# Patient Record
Sex: Female | Born: 1965 | Race: White | Hispanic: No | Marital: Married | Smoking: Never smoker
Health system: Southern US, Community
[De-identification: ages and names within clinical notes are randomized; demographics above are authoritative.]

## PROBLEM LIST (undated history)

## (undated) DIAGNOSIS — E039 Hypothyroidism, unspecified: Secondary | ICD-10-CM

## (undated) HISTORY — PX: VAGINAL HYSTERECTOMY: SUR661

## (undated) HISTORY — DX: Hypothyroidism, unspecified: E03.9

---

## 2019-07-23 ENCOUNTER — Telehealth (INDEPENDENT_AMBULATORY_CARE_PROVIDER_SITE_OTHER): Payer: Managed Care, Other (non HMO) | Admitting: Neurology

## 2019-07-23 DIAGNOSIS — G43009 Migraine without aura, not intractable, without status migrainosus: Secondary | ICD-10-CM | POA: Diagnosis not present

## 2019-07-24 ENCOUNTER — Telehealth: Payer: Self-pay | Admitting: Neurology

## 2019-07-24 NOTE — Telephone Encounter (Signed)
Patient called in regarding her medications and her Pharmacy[facility] [place] [place]. She had a VV with Dr. Tomi Likens on 07/23/19. She is needing her medications Zofran, Propanolol and a Migraine medication called in for her. Please double check medication names.  Thanks

## 2019-07-24 NOTE — Telephone Encounter (Signed)
After receiving a call from a Hinton Dyer with a different account # and dob Dr. Tomi Likens called the patient at [phone] to confirm that the patient was seen by him via virtual visit on 07/23/19. Patient confirmed that she was seen by [facility] via virtual visit on 07/23/19. I called and spoke to patient to try and establish the correct account information and discovered that the patient had never been seen by a [facility] provider in the past and therefore did not have an account. I had Lomas , [employer] gather the correct information from the patient and create an account in Epic for the patient. Chelsea created the account and patient was made aware that there was an error in documenting her visit from 07/23/19 due to there being another patient in the system with that same name. Patient verbalized understanding and is aware that we will correct this and have the OV notes from 07/23/19 moved to the correct account. I also informed patient that I would submit this as safety zone portal to make our compliance department aware of the documentation error.

## 2019-07-25 ENCOUNTER — Other Ambulatory Visit: Payer: Self-pay | Admitting: Neurology

## 2019-07-25 MED ORDER — PROPRANOLOL HCL ER 60 MG PO CP24: 60.0000 mg | ORAL_CAPSULE | Freq: Every day | ORAL | 3 refills | Status: DC

## 2019-07-25 MED ORDER — ONDANSETRON 4 MG PO TBDP: 4.0000 mg | ORAL_TABLET | Freq: Three times a day (TID) | ORAL | 3 refills | Status: DC | PRN

## 2019-07-25 MED ORDER — ELETRIPTAN HYDROBROMIDE 40 MG PO TABS: ORAL_TABLET | ORAL | 3 refills | Status: DC

## 2019-07-25 NOTE — Progress Notes (Addendum)
Virtual Visit via Video Note The purpose of this virtual visit is to provide medical care while limiting exposure to the novel coronavirus.    Consent was obtained for video visit:  Yes.   Answered questions that patient had about telehealth interaction:  Yes.   I discussed the limitations, risks, security and privacy concerns of performing an evaluation and management service by telemedicine. I also discussed with the patient that there may be a patient responsible charge related to this service. The patient expressed understanding and agreed to proceed.  Pt location: Home Physician Location: office Name of referring provider:  No ref. provider found I connected with Shelby Maxwell at patients initiation/request on 07/23/2019 at  2:50 PM EDT by video enabled telemedicine application and verified that I am speaking with the correct person using two identifiers. Pt MRN:  [id number] Pt DOB:  Mar 30, 1966 Video Participants:  Shelby Maxwell   History of Present Illness:  Shelby Maxwell is a 53 year old female with Hashimoto's thyroiditis who presents for migraines.  History supplemented by referring provider note.   Onset:  In her 44s.  Over the past year, they have been more frequent.   Location:  Mostly frontal but sometimes base of neck Quality:  Pressure, throbbing, pounding, squeezing Intensity:  8/10.  She denies new headache, thunderclap headache Aura:  no Premonitory Phase:  no Postdrome:  no Associated symptoms:  Nausea, photophobia, phonophobia, osmophobia.  She denies associated visual disturbance, vomiting, unilateral numbness or weakness. Duration:  She often wakes up with them.  8 to 10 hours.  Severe once last 24 hours Frequency:  2 to 3 days a week, once a month she cannot function Frequency of abortive medication: 2 to 3 days a week. Triggers:  Certain smells (blueberry scent, perfume, scented candles), dairy, very cold beverages, sweets Relieving factors:  Rest in  bed Activity:  Once a month she cannot function   Current NSAIDS:  ASA 81mg  Current analgesics:  Goody Current triptans:  none Current ergotamine:  none Current anti-emetic:  none Current muscle relaxants:  none Current anti-anxiolytic:  none Current sleep aide:  none Current Antihypertensive medications:  none Current Antidepressant medications:  Wellbutrin Current Anticonvulsant medications:  none Current anti-CGRP:  none Current Vitamins/Herbal/Supplements:  D Current Antihistamines/Decongestants:  none Other therapy:  none Hormone/birth control:  none   Past NSAIDS:  ibuprofen Past analgesics:  Tylenol Past abortive triptans:  Sumatriptan 50mg  tablet (causes head pressure and feels like throat is closing) Past abortive ergotamine:  none Past muscle relaxants:  none Past anti-emetic:  Zofran Past antihypertensive medications:  none Past antidepressant medications:  Lexapro (weight gain) Past anticonvulsant medications:  none Past anti-CGRP:  none Past vitamins/Herbal/Supplements:  none Past antihistamines/decongestants:  none Other past therapies:  none   Caffeine:  1 cup of coffee daily Diet:  Drinks 2-3 16 oz bottles of water daily.  Tries to eat healthy.  Eats 3 meals a day.  No soda Exercise:  no Depression:  controlled; Anxiety:  Controlled.  She has a child with ADHD and autism Other pain:  no Sleep hygiene:  Ok (6-8 hours a night) Family history of headache:  mother  Past Medical History: Hashimoto's thyroiditis  Medications: Wellbutrin ASA  Allergies: No known allergies  Family History: Mother:  Migraines, CAD  Social History: Social History   Socioeconomic History  . Marital status: Married    Spouse name: Not on file  . Number of children: Not on file  . Years of  education: Not on file  . Highest education level: Not on file  Occupational History  . Not on file  Social Needs  . Financial resource strain: Not on file  . Food insecurity     Worry: Not on file    Inability: Not on file  . Transportation needs    Medical: Not on file    Non-medical: Not on file  Tobacco Use  . Smoking status: Not on file  Substance and Sexual Activity  . Alcohol use: Not on file  . Drug use: Not on file  . Sexual activity: Not on file  Lifestyle  . Physical activity    Days per week: Not on file    Minutes per session: Not on file  . Stress: Not on file  Relationships  . Social Herbalist on phone: Not on file    Gets together: Not on file    Attends religious service: Not on file    Active member of club or organization: Not on file    Attends meetings of clubs or organizations: Not on file    Relationship status: Not on file  . Intimate partner violence    Fear of current or ex partner: Not on file    Emotionally abused: Not on file    Physically abused: Not on file    Forced sexual activity: Not on file  Other Topics Concern  . Not on file  Social History Narrative  . Not on file    Observations/Objective:   No acute distress.  Alert and oriented.  Speech fluent and not dysarthric.  Language intact.  Eyes orthophoric on primary gaze.  Face symmetric.  Assessment and Plan:   Migraine without aura, without status migrainosus, not intractable   1.  For preventative management, start propranolol ER 60mg  daily.  In 4 weeks, we can increase to 80mg  daily if needed/tolerated 2.  For abortive therapy, Relpax 40mg .  Zofran for nausea 3.  Limit use of pain relievers to no more than 2 days out of week to prevent risk of rebound or medication-overuse headache. 4.  Keep headache diary 5.  Exercise, hydration, caffeine cessation, sleep hygiene, monitor for and avoid triggers 6.  Consider:  magnesium citrate 400mg  daily, riboflavin 400mg  daily, and coenzyme Q10 100mg  three times daily 7. Always keep in mind that currently taking a hormone or birth control may be a possible trigger or aggravating factor for migraine. 8.  Follow up 4 months.    Follow Up Instructions:    -I discussed the assessment and treatment plan with the patient. The patient was provided an opportunity to ask questions and all were answered. The patient agreed with the plan and demonstrated an understanding of the instructions.   The patient was advised to call back or seek an in-person evaluation if the symptoms worsen or if the condition fails to improve as anticipated.  Time spent with patient:  40 minutes    Dudley Major, DO

## 2019-07-25 NOTE — Telephone Encounter (Signed)
Prescriptions for propranolol ER, eletriptan and [facility] [place] [place]

## 2019-07-26 ENCOUNTER — Telehealth: Payer: Self-pay | Admitting: Neurology

## 2019-07-26 NOTE — Telephone Encounter (Signed)
Patient called in regarding her medication Eletriptan and that  Insurance will only Authorize and pay for a 12 day supply. [facility] [place] [place]. Please Call. Thanks

## 2019-07-27 ENCOUNTER — Other Ambulatory Visit: Payer: Self-pay | Admitting: Neurology

## 2019-07-27 MED ORDER — ELETRIPTAN HYDROBROMIDE 40 MG PO TABS: ORAL_TABLET | ORAL | 3 refills | Status: DC

## 2019-07-27 NOTE — Telephone Encounter (Signed)
Resent prescription for eletriptan with quantity of 12 tablets

## 2019-10-06 ENCOUNTER — Other Ambulatory Visit: Payer: Self-pay | Admitting: Neurology

## 2019-11-20 ENCOUNTER — Encounter: Payer: Self-pay | Admitting: Neurology

## 2019-11-21 NOTE — Progress Notes (Signed)
Virtual Visit via Video Note The purpose of this virtual visit is to provide medical care while limiting exposure to the novel coronavirus.    Consent was obtained for video visit:  Yes.   Answered questions that patient had about telehealth interaction:  Yes.   I discussed the limitations, risks, security and privacy concerns of performing an evaluation and management service by telemedicine. I also discussed with the patient that there may be a patient responsible charge related to this service. The patient expressed understanding and agreed to proceed.  Pt location: Home Physician Location: office Name of referring provider:  No ref. provider found I connected with Shelby Maxwell at patients initiation/request on 11/22/2019 at 10:30 AM EST by video enabled telemedicine application and verified that I am speaking with the correct person using two identifiers. Pt MRN:  [id number] Pt DOB:  [birth year] Video Participants:  Shelby Maxwell   History of Present Illness:  Shelby Maxwell is a 54 year old female with Hashimoto's thyroiditis who follows up for migraines.  UPDATE: Started propranolol and Relpax.  She has had decreased frequency of headaches.  Tolerating propranolol.  Blood pressure typically 117/60s  Pulse normally in the high 70s, but on two or three occasions, it was around 58 to 60, in which she felt dizzy.   Relpax caused heavy-headed feeling and ineffective.  Intensity:  4-5/10 Duration:  6-7 hours Frequency:  2 to 3 a week.  1 migraine in December had to miss work. Current NSAIDS:  ASA 81mg  Current analgesics:  none Current triptans:  Relpax 40mg .  Head feels "full" after she takes it. Current ergotamine:  none Current anti-emetic:  Zofran ODT 4mg  Current muscle relaxants:  none Current anti-anxiolytic:  none Current sleep aide:  none Current Antihypertensive medications:  propranolol ER 60mg  Current Antidepressant medications:  none Current Anticonvulsant  medications:  none Current anti-CGRP:  none Current Vitamins/Herbal/Supplements:  D Current Antihistamines/Decongestants:  none Other therapy:  cold compress for mild headaches. Hormone/birth control:  none  Caffeine:  1 cup of coffee daily Diet:  Drinks 2-3 16 oz bottles of water daily.  Tries to eat healthy.  Eats 3 meals a day.  No soda Exercise:  no Depression:  controlled; Anxiety:  Controlled.  She has a child with ADHD and autism Other pain:  Left sided neck pain.  Wakes up every morning with neck pain.   Sleep hygiene:  Ok (6-8 hours a night)  HISTORY: Onset:  In her 32s.  Over the past year, they have been more frequent.   Location:  Mostly behind right eye, frontal but sometimes base of neck. Quality:  Pressure, throbbing, pounding, squeezing Initial intensity:  8/10.  She denies new headache, thunderclap headache Aura:  no Premonitory Phase:  no Postdrome:  no Associated symptoms:  Nausea, photophobia, phonophobia, osmophobia.  She denies associated visual disturbance, vomiting, unilateral numbness or weakness. Initial duration:  She often wakes up with them.  8 to 10 hours.  Severe once last 24 hours Initial frequency:  2 to 3 days a week, once a month she cannot function Initial frequency of abortive medication: 2 to 3 days a week. Triggers:  Certain smells (blueberry scent, perfume, scented candles), dairy, very cold beverages, sweets Relieving factors:  Rest in bed Activity:  Once a month she cannot function  Past NSAIDS:  ibuprofen Past analgesics:  Tylenol; Goody powder Past abortive triptans:  Sumatriptan 50mg  tablet (causes head pressure and feels like throat is closing) Past abortive ergotamine:  none Past muscle relaxants:  none Past anti-emetic:  Zofran Past antihypertensive medications:  none Past antidepressant medications:  Lexapro (weight gain); Wellbutrin Past anticonvulsant medications:  none Past anti-CGRP:  none Past vitamins/Herbal/Supplements:   none Past antihistamines/decongestants:  none Other past therapies:  none   Family history of headache:  mother  Past Medical History: Past Medical History:  Diagnosis Date  . Hypothyroidism     Medications: Outpatient Encounter Medications as of 11/22/2019  Medication Sig  . eletriptan (RELPAX) 40 MG tablet Take 1 tablet earliest onset of migraine.  May repeat in 2 hours if headache persists or recurs.  Maximum 2 tablets in 24 hours  . ondansetron (ZOFRAN ODT) 4 MG disintegrating tablet Take 1 tablet (4 mg total) by mouth every 8 (eight) hours as needed for nausea or vomiting.  . propranolol ER (INDERAL LA) 60 MG 24 hr capsule TAKE 1 CAPSULE BY MOUTH EVERY DAY  . SYNTHROID 50 MCG tablet TAKE 1 TABLET BY MOUTH FOR 6 DAYS A WEEK   No facility-administered encounter medications on file as of 11/22/2019.    Allergies: Allergies  Allergen Reactions  . Codeine   . Latex     Family History: No family history on file.  Social History: Social History   Socioeconomic History  . Marital status: Married    Spouse name: Not on file  . Number of children: Not on file  . Years of education: Not on file  . Highest education level: Not on file  Occupational History  . Not on file  Tobacco Use  . Smoking status: Never Smoker  . Smokeless tobacco: Never Used  Substance and Sexual Activity  . Alcohol use: Never  . Drug use: Not on file  . Sexual activity: Not on file  Other Topics Concern  . Not on file  Social History Narrative   Right handed   One story home   One cup caffeine dailyl   Social Determinants of Health   Financial Resource Strain:   . Difficulty of Paying Living Expenses: Not on file  Food Insecurity:   . Worried About Charity fundraiser in the Last Year: Not on file  . Ran Out of Food in the Last Year: Not on file  Transportation Needs:   . Lack of Transportation (Medical): Not on file  . Lack of Transportation (Non-Medical): Not on file  Physical  Activity:   . Days of Exercise per Week: Not on file  . Minutes of Exercise per Session: Not on file  Stress:   . Feeling of Stress : Not on file  Social Connections:   . Frequency of Communication with Friends and Family: Not on file  . Frequency of Social Gatherings with Friends and Family: Not on file  . Attends Religious Services: Not on file  . Active Member of Clubs or Organizations: Not on file  . Attends Archivist Meetings: Not on file  . Marital Status: Not on file  Intimate Partner Violence:   . Fear of Current or Ex-Partner: Not on file  . Emotionally Abused: Not on file  . Physically Abused: Not on file  . Sexually Abused: Not on file    Observations/Objective:   Blood pressure 128/83, pulse 78, height 5\' 3"  (1.6 m), weight 165 lb (74.8 kg), SpO2 90 %. No acute distress.  Alert and oriented.  Speech fluent and not dysarthric.  Language intact.  Eyes orthophoric on primary gaze.  Face symmetric.  Assessment and Plan:  Migraine without aura, without status migrainosus, not intractable  1.  For preventative management, increase propranolol ER to 80mg  daily.  Monitor for lightheadedness or more frequent bradycardia. 2.  For abortive therapy, will try Nurtec.  She has failed and had adverse reaction to two triptans (sumatriptan and eletriptan) 3. PT neck 4.  Limit use of pain relievers to no more than 2 days out of week to prevent risk of rebound or medication-overuse headache. 5.  Keep headache diary 6.  Exercise, hydration, caffeine cessation, sleep hygiene, monitor for and avoid triggers 7.  Follow up 4 months   Follow Up Instructions:    -I discussed the assessment and treatment plan with the patient. The patient was provided an opportunity to ask questions and all were answered. The patient agreed with the plan and demonstrated an understanding of the instructions.   The patient was advised to call back or seek an in-person evaluation if the symptoms  worsen or if the condition fails to improve as anticipated.   , DO

## 2019-11-22 ENCOUNTER — Telehealth (INDEPENDENT_AMBULATORY_CARE_PROVIDER_SITE_OTHER): Payer: Managed Care, Other (non HMO) | Admitting: Neurology

## 2019-11-22 ENCOUNTER — Other Ambulatory Visit: Payer: Self-pay

## 2019-11-22 ENCOUNTER — Telehealth: Payer: Self-pay | Admitting: Neurology

## 2019-11-22 ENCOUNTER — Encounter: Payer: Self-pay | Admitting: Neurology

## 2019-11-22 VITALS — BP 128/83 | HR 78 | Ht 63.0 in | Wt 165.0 lb

## 2019-11-22 DIAGNOSIS — M542 Cervicalgia: Secondary | ICD-10-CM | POA: Diagnosis not present

## 2019-11-22 DIAGNOSIS — G43009 Migraine without aura, not intractable, without status migrainosus: Secondary | ICD-10-CM | POA: Diagnosis not present

## 2019-11-22 MED ORDER — NURTEC 75 MG PO TBDP: 75.0000 mg | ORAL_TABLET | Freq: Every day | ORAL | 11 refills | Status: DC | PRN

## 2019-11-22 MED ORDER — ONDANSETRON 4 MG PO TBDP: 4.0000 mg | ORAL_TABLET | Freq: Three times a day (TID) | ORAL | 3 refills | Status: DC | PRN

## 2019-11-22 MED ORDER — PROPRANOLOL HCL ER 80 MG PO CP24: 80.0000 mg | ORAL_CAPSULE | Freq: Every day | ORAL | 3 refills | Status: DC

## 2019-11-22 NOTE — Telephone Encounter (Signed)
Patient forgot to mention during her Virtual Visit this morning that she needs a Refill on her Zofran. [facility] [place] [place]. Thank you

## 2020-02-07 ENCOUNTER — Other Ambulatory Visit: Payer: Self-pay | Admitting: Neurology

## 2020-03-20 NOTE — Progress Notes (Deleted)
NEUROLOGY FOLLOW UP OFFICE NOTE  Shelby Maxwell [id number]  HISTORY OF PRESENT ILLNESS: Shelby Maxwell is a 54 year old female with Hashimoto's thyroiditis who follows up for migraines.  UPDATE: Propranolol increased to 80mg  in January. She was referred to physical therapy for neck pain.    Intensity:  4-5/10 Duration:  6-7 hours Frequency:  2 to 3 a week.  1 migraine in December had to miss work. Current NSAIDS: ASA 81mg  Current analgesics: none Current triptans: none Current ergotamine: none Current anti-emetic: Zofran ODT 4mg  Current muscle relaxants: none Current anti-anxiolytic: none Current sleep aide: none Current Antihypertensive medications: propranolol ER 60mg  Current Antidepressant medications: none Current Anticonvulsant medications: none Current anti-CGRP: Nurtec Current Vitamins/Herbal/Supplements: D Current Antihistamines/Decongestants: none Other therapy: cold compress for mild headaches. Hormone/birth control: none  Caffeine: 1 cup of coffee daily Diet: Drinks 2-3 16 oz bottles of water daily. Tries to eat healthy. Eats 3 meals a day. No soda Exercise: no Depression: controlled; Anxiety: Controlled. She has a child with ADHD and autism Other pain: Left sided neck pain.  Wakes up every morning with neck pain.   Sleep hygiene: Ok (6-8 hours a night)  HISTORY: Onset: In her 41s. Over the past year, they have been more frequent.  Location: Mostly behind right eye, frontal but sometimes base of neck. Quality: Pressure, throbbing, pounding, squeezing Initial intensity: 8/10. She denies new headache, thunderclap headache Aura: no Premonitory Phase: no Postdrome: no Associated symptoms: Nausea, photophobia, phonophobia, osmophobia. She denies associated visual disturbance, vomiting, unilateral numbness or weakness. Initial duration: She often wakes up with them. 8 to 10 hours. Severe once last 24  hours Initial frequency: 2 to 3 days a week, once a month she cannot function Initial frequency of abortive medication: 2 to 3 days a week. Triggers: Certain smells (blueberry scent, perfume, scented candles), dairy, very cold beverages, sweets Relieving factors: Rest in bed Activity: Once a month she cannot function  Past NSAIDS: ibuprofen Past analgesics: Tylenol; Goody powder Past abortive triptans: Sumatriptan 50mg  tablet (causes head pressure and feels like throat is closing); Relpax (head feels heavy) Past abortive ergotamine: none Past muscle relaxants: none Past anti-emetic: Zofran Past antihypertensive medications: none Past antidepressant medications: Lexapro (weight gain); Wellbutrin Past anticonvulsant medications: none Past anti-CGRP: none Past vitamins/Herbal/Supplements: none Past antihistamines/decongestants: none Other past therapies: none   Family history of headache: mother  PAST MEDICAL HISTORY: Past Medical History:  Diagnosis Date  . Hypothyroidism     MEDICATIONS: Current Outpatient Medications on File Prior to Visit  Medication Sig Dispense Refill  . aspirin 325 MG tablet aspirin  1 tablet daily    . eletriptan (RELPAX) 40 MG tablet Take 1 tablet earliest onset of migraine.  May repeat in 2 hours if headache persists or recurs.  Maximum 2 tablets in 24 hours 12 tablet 3  . linaclotide (LINZESS) 72 MCG capsule Linzess 72 mcg capsule    . ondansetron (ZOFRAN ODT) 4 MG disintegrating tablet Take 1 tablet (4 mg total) by mouth every 8 (eight) hours as needed for nausea or vomiting. 20 tablet 3  . propranolol ER (INDERAL LA) 80 MG 24 hr capsule TAKE 1 CAPSULE BY MOUTH EVERY DAY 90 capsule 1  . Rimegepant Sulfate (NURTEC) 75 MG TBDP Take 75 mg by mouth daily as needed. 8 tablet 11  . SYNTHROID 50 MCG tablet TAKE 1 TABLET BY MOUTH FOR 6 DAYS A WEEK     No current facility-administered medications on file prior to visit.  ALLERGIES: Allergies  Allergen Reactions  . Codeine   . Latex     FAMILY HISTORY: No family history on file.   SOCIAL HISTORY: Social History   Socioeconomic History  . Marital status: Married    Spouse name: Not on file  . Number of children: Not on file  . Years of education: Not on file  . Highest education level: Not on file  Occupational History  . Not on file  Tobacco Use  . Smoking status: Never Smoker  . Smokeless tobacco: Never Used  Substance and Sexual Activity  . Alcohol use: Never  . Drug use: Not on file  . Sexual activity: Not on file  Other Topics Concern  . Not on file  Social History Narrative   Right handed   One story home   One cup caffeine dailyl   Social Determinants of Health   Financial Resource Strain:   . Difficulty of Paying Living Expenses:   Food Insecurity:   . Worried About Charity fundraiser in the Last Year:   . Arboriculturist in the Last Year:   Transportation Needs:   . Film/video editor (Medical):   Shelby Maxwell Kitchen Lack of Transportation (Non-Medical):   Physical Activity:   . Days of Exercise per Week:   . Minutes of Exercise per Session:   Stress:   . Feeling of Stress :   Social Connections:   . Frequency of Communication with Friends and Family:   . Frequency of Social Gatherings with Friends and Family:   . Attends Religious Services:   . Active Member of Clubs or Organizations:   . Attends Archivist Meetings:   Shelby Maxwell Kitchen Marital Status:   Intimate Partner Violence:   . Fear of Current or Ex-Partner:   . Emotionally Abused:   Shelby Maxwell Kitchen Physically Abused:   . Sexually Abused:     PHYSICAL EXAM: *** General: No acute distress.  Patient appears well-groomed.   Head:  Normocephalic/atraumatic Eyes:  Fundi examined but not visualized Neck: supple, no paraspinal tenderness, full range of motion Heart:  Regular rate and rhythm Lungs:  Clear to auscultation bilaterally Back: No paraspinal tenderness Neurological  Exam: alert and oriented to person, place, and time. Attention span and concentration intact, recent and remote memory intact, fund of knowledge intact.  Speech fluent and not dysarthric, language intact.  CN II-XII intact. Bulk and tone normal, muscle strength 5/5 throughout.  Sensation to light touch, temperature and vibration intact.  Deep tendon reflexes 2+ throughout, toes downgoing.  Finger to nose and heel to shin testing intact.  Gait normal, Romberg negative.  IMPRESSION: Migraine without aura, without status migrainosus, not intractable  PLAN: 1.  For preventative management, *** 2.  For abortive therapy, *** 3.  Limit use of pain relievers to no more than 2 days out of week to prevent risk of rebound or medication-overuse headache. 4.  Keep headache diary 5.  Exercise, hydration, caffeine cessation, sleep hygiene, monitor for and avoid triggers 6.  Follow up ***   Metta Clines, DO

## 2020-03-21 ENCOUNTER — Other Ambulatory Visit: Payer: Self-pay | Admitting: Neurology

## 2020-03-21 ENCOUNTER — Ambulatory Visit: Payer: Managed Care, Other (non HMO) | Admitting: Neurology

## 2020-04-18 ENCOUNTER — Other Ambulatory Visit: Payer: Self-pay | Admitting: Neurology

## 2020-05-26 ENCOUNTER — Other Ambulatory Visit: Payer: Self-pay | Admitting: Neurology

## 2020-05-27 ENCOUNTER — Other Ambulatory Visit: Payer: Self-pay

## 2020-05-27 ENCOUNTER — Other Ambulatory Visit: Payer: Self-pay | Admitting: Neurology

## 2020-05-27 MED ORDER — NURTEC 75 MG PO TBDP: 75.0000 mg | ORAL_TABLET | Freq: Every day | ORAL | 4 refills | Status: DC | PRN

## 2020-05-27 MED ORDER — ONDANSETRON 4 MG PO TBDP: ORAL_TABLET | ORAL | 3 refills | Status: DC

## 2020-05-27 NOTE — Telephone Encounter (Signed)
Telephone call to pt, Pt has 11 refills of Nurtec sent in to her pharmacy 10/2019. Per pt she has picked up all but one refill. Zofran she has to use more than the 20 tablets a month which the script was written for in 10/2019.   Advised pt will speak to Dr. Everlena Cooper to see if pt could get refills.  Pt transferred to the front desk to schedule a visit per her last note.    [phone], Pt insurance allowed pt to pick her Nurtec  more then once a month. Pt has 44 tabs left of Zofran.

## 2020-05-27 NOTE — Telephone Encounter (Signed)
[facility] to [place] but has not heard anything back from [place]. She will need a refill on the Zofran and the Nurtec   [facility] [place]

## 2020-07-25 ENCOUNTER — Other Ambulatory Visit: Payer: Self-pay | Admitting: Neurology

## 2020-07-28 NOTE — Progress Notes (Signed)
Virtual Visit via Video Note The purpose of this virtual visit is to provide medical care while limiting exposure to the novel coronavirus.    Consent was obtained for video visit:  Yes.   Answered questions that patient had about telehealth interaction:  Yes.   I discussed the limitations, risks, security and privacy concerns of performing an evaluation and management service by telemedicine. I also discussed with the patient that there may be a patient responsible charge related to this service. The patient expressed understanding and agreed to proceed.  Pt location: Home Physician Location: office Name of referring provider:  No ref. provider found I connected with Shelby Maxwell at patients initiation/request on 07/29/2020 at  9:50 AM EDT by video enabled telemedicine application and verified that I am speaking with the correct person using two identifiers. Pt MRN:  [id number] Pt DOB:  05-20-1966 Video Participants:  Shelby Maxwell   History of Present Illness:  Shelby Maxwell is a 54 year old female with Hashimoto's thyroiditis who follows up for migraines.  UPDATE: Increased propranolol in January.  No significant improvement.  Tolerating the propranolol but concerned about her pulse (usually it is in the 60s).  Intensity:  4-5/10 Duration:  6-7 hours, rarely may last 2 to 3 days. Frequency:  2 to 3 a week.   Rescue therapy:  Nurtec and Zofran. Current NSAIDS: ASA 81mg  Current analgesics: none Current triptans: none Current ergotamine: none Current anti-emetic: Zofran ODT 4mg  Current muscle relaxants: none Current anti-anxiolytic: none Current sleep aide: none Current Antihypertensive medications: propranolol ER 80mg  Current Antidepressant medications: none Current Anticonvulsant medications: none Current anti-CGRP: Nurtec (abortive) Current Vitamins/Herbal/Supplements: D Current Antihistamines/Decongestants: none Other therapy: cold compress for  mild headaches. Hormone/birth control: none  Caffeine: 1 cup of coffee daily Diet: Drinks 2-3 16 oz bottles of water daily. Tries to eat healthy. Eats 3 meals a day. No soda Exercise: no Depression: controlled; Anxiety: Controlled. She has a child with ADHD and autism Other pain: Left sided neck pain.  Wakes up every morning with neck pain.   Sleep hygiene: Ok (6-8 hours a night)  HISTORY: Onset: In her 39s. Over the past year, they have been more frequent.  Location: Mostly behind right eye, frontal but sometimes base of neck. Quality: Pressure, throbbing, pounding, squeezing Initial intensity: 8/10. She denies new headache, thunderclap headache Aura: no Premonitory Phase: no Postdrome: no Associated symptoms: Nausea, photophobia, phonophobia, osmophobia. She denies associated visual disturbance, vomiting, unilateral numbness or weakness. Initial duration: She often wakes up with them. 8 to 10 hours. Severe once last 24 hours Initial frequency: 2 to 3 days a week, once a month she cannot function Initial frequency of abortive medication: 2 to 3 days a week. Triggers: Certain smells (blueberry scent, perfume, scented candles), dairy, very cold beverages, sweets Relieving factors: Rest in bed Activity: Once a month she cannot function  Past NSAIDS: ibuprofen Past analgesics: Tylenol; Goody powder Past abortive triptans: Sumatriptan 50mg  tablet (causes head pressure and feels like throat is closing), Relpax (head pressure) Past abortive ergotamine: none Past muscle relaxants: none Past anti-emetic: Zofran Past antihypertensive medications: none Past antidepressant medications: Lexapro (weight gain); Wellbutrin Past anticonvulsant medications: none Past anti-CGRP: none Past vitamins/Herbal/Supplements: none Past antihistamines/decongestants: none Other past therapies: none   Family history of headache: mother  Past Medical  History: Past Medical History:  Diagnosis Date  . Hypothyroidism     Medications: Outpatient Encounter Medications as of 07/29/2020  Medication Sig  . aspirin 325 MG tablet  aspirin  1 tablet daily  . eletriptan (RELPAX) 40 MG tablet Take 1 tablet earliest onset of migraine.  May repeat in 2 hours if headache persists or recurs.  Maximum 2 tablets in 24 hours  . linaclotide (LINZESS) 72 MCG capsule Linzess 72 mcg capsule  . ondansetron (ZOFRAN-ODT) 4 MG disintegrating tablet DISSOLVE 1 TABLET IN MOUTH EVERY 8 HOURS AS NEEDED FOR NAUSEA AND VOMITING  . propranolol ER (INDERAL LA) 80 MG 24 hr capsule TAKE 1 CAPSULE BY MOUTH EVERY DAY  . Rimegepant Sulfate (NURTEC) 75 MG TBDP Take 75 mg by mouth daily as needed.  Marland Kitchen SYNTHROID 50 MCG tablet TAKE 1 TABLET BY MOUTH FOR 6 DAYS A WEEK   No facility-administered encounter medications on file as of 07/29/2020.    Allergies: Allergies  Allergen Reactions  . Codeine   . Latex     Family History: No family history on file.  Social History: Social History   Socioeconomic History  . Marital status: Married    Spouse name: Not on file  . Number of children: Not on file  . Years of education: Not on file  . Highest education level: Not on file  Occupational History  . Not on file  Tobacco Use  . Smoking status: Never Smoker  . Smokeless tobacco: Never Used  Vaping Use  . Vaping Use: Never used  Substance and Sexual Activity  . Alcohol use: Never  . Drug use: Not on file  . Sexual activity: Not on file  Other Topics Concern  . Not on file  Social History Narrative   Right handed   One story home   One cup caffeine dailyl   Social Determinants of Health   Financial Resource Strain:   . Difficulty of Paying Living Expenses: Not on file  Food Insecurity:   . Worried About Programme researcher, broadcasting/film/video in the Last Year: Not on file  . Ran Out of Food in the Last Year: Not on file  Transportation Needs:   . Lack of Transportation  (Medical): Not on file  . Lack of Transportation (Non-Medical): Not on file  Physical Activity:   . Days of Exercise per Week: Not on file  . Minutes of Exercise per Session: Not on file  Stress:   . Feeling of Stress : Not on file  Social Connections:   . Frequency of Communication with Friends and Family: Not on file  . Frequency of Social Gatherings with Friends and Family: Not on file  . Attends Religious Services: Not on file  . Active Member of Clubs or Organizations: Not on file  . Attends Banker Meetings: Not on file  . Marital Status: Not on file  Intimate Partner Violence:   . Fear of Current or Ex-Partner: Not on file  . Emotionally Abused: Not on file  . Physically Abused: Not on file  . Sexually Abused: Not on file    Observations/Objective:   Height 5\' 2"  (1.575 m), weight 160 lb (72.6 kg). No acute distress.  Alert and oriented.  Speech fluent and not dysarthric.  Language intact.   Assessment and Plan:   Migraine without aura, without status migrainosus, not intractable  1.  For preventative management, increase propranolol ER to 120mg  daily.  If she has a side effect or if no improvement in 6 weeks, she will contact me and will switch to topiramate 2.  For abortive therapy, Nurtec 3.  Limit use of pain relievers to no more than  2 days out of week to prevent risk of rebound or medication-overuse headache. 4.  Keep headache diary 5.  Exercise, hydration, caffeine cessation, sleep hygiene, monitor for and avoid triggers 6.  Follow up 6 months.   Follow Up Instructions:    -I discussed the assessment and treatment plan with the patient. The patient was provided an opportunity to ask questions and all were answered. The patient agreed with the plan and demonstrated an understanding of the instructions.   The patient was advised to call back or seek an in-person evaluation if the symptoms worsen or if the condition fails to improve as  anticipated.   Cira Servant, DO

## 2020-07-29 ENCOUNTER — Telehealth (INDEPENDENT_AMBULATORY_CARE_PROVIDER_SITE_OTHER): Payer: Managed Care, Other (non HMO) | Admitting: Neurology

## 2020-07-29 ENCOUNTER — Encounter: Payer: Self-pay | Admitting: Neurology

## 2020-07-29 ENCOUNTER — Other Ambulatory Visit: Payer: Self-pay

## 2020-07-29 VITALS — Ht 62.0 in | Wt 160.0 lb

## 2020-07-29 DIAGNOSIS — G43009 Migraine without aura, not intractable, without status migrainosus: Secondary | ICD-10-CM

## 2020-07-29 MED ORDER — PROPRANOLOL HCL ER 120 MG PO CP24: 120.0000 mg | ORAL_CAPSULE | Freq: Every day | ORAL | 5 refills | Status: DC

## 2020-10-02 ENCOUNTER — Telehealth: Payer: Self-pay | Admitting: Neurology

## 2020-10-02 MED ORDER — TOPIRAMATE 25 MG PO TABS: ORAL_TABLET | ORAL | 0 refills | Status: DC

## 2020-10-02 NOTE — Telephone Encounter (Signed)
She should discontinue propranolol.  Instead, I want to start topiramate 25mg  at bedtime for one week, then increase to 50mg  at bedtime.  If no headache improvement by time of refill, then should contact .

## 2020-10-02 NOTE — Telephone Encounter (Signed)
Patient states that the propanolol isn't working for her. She was told to call and let Dr Everlena Cooper know. Please call.

## 2020-10-02 NOTE — Telephone Encounter (Signed)
Pt called and informed to  discontinue propranolol.  Dr Everlena Cooper wants her  to start topiramate 25mg  at bedtime for one week, then increase to 50mg  at bedtime.  If no headache improvement by time of refill, then should contact 

## 2020-10-06 ENCOUNTER — Ambulatory Visit: Payer: Managed Care, Other (non HMO) | Admitting: Neurology

## 2020-10-06 NOTE — Progress Notes (Signed)
NEUROLOGY FOLLOW UP OFFICE NOTE  Shelby Maxwell [id number]   Subjective:  Shelby Maxwell is a 54year old female with Hashimoto's thyroiditis who follows up for migraine.  UPDATE: Propranolol was increased in October.  Headaches were moderate lasting several hours and occurring every 2 to 3 days.  As it was ineffective, it was discontinued.  She started topiramate last week.  This past weekend, she had a more severe migraine lasting 4 days.  It seemed to have gotten worse after eating pizza.  She had to take 3 Nurtec before it broke.  Typically, headaches would last 6 hours but just a dull headache.  She notes paresthesias in her fingers.  Blood pressure has been controlled.    Rescue therapy:  Nurtec and Zofran. Current NSAIDS: ASA 81mg  Current analgesics:none Current triptans:none Current ergotamine: none Current anti-emetic:Zofran ODT 4mg  Current muscle relaxants: none Current anti-anxiolytic: none Current sleep aide: none Current Antihypertensive medications:none Current Antidepressant medications:none Current Anticonvulsant medications: topiramate  Current anti-CGRP: Nurtec (abortive) Current Vitamins/Herbal/Supplements: D Current Antihistamines/Decongestants: none Other therapy:cold compress for mild headaches. Hormone/birth control: none  Caffeine: 1 cup of coffee daily Diet: Drinks 2-3 16 oz bottles of water daily. Tries to eat healthy. Eats 3 meals a day. No soda Exercise: no Depression: controlled; Anxiety: Controlled. She has a child with ADHD and autism Other pain:Left sided neck pain. Wakes up every morning with neck pain.  Sleep hygiene: Ok (6-8 hours a night) Works on computer 9 hours a day.    HISTORY: Onset: In her 1s. Over the past year, they have been more frequent.  Location: Mostlybehind right eye,frontal but sometimes base of neck. Quality: Pressure, throbbing, pounding, squeezing Initial  intensity: 8/10. She denies new headache, thunderclap headache Aura: no Premonitory Phase: no Postdrome: no Associated symptoms: Nausea, photophobia, phonophobia, osmophobia. She denies associated visual disturbance, vomiting, unilateral numbness or weakness. Initial duration: She often wakes up with them. 8 to 10 hours. Severe once last 24 hours Initial frequency: 2 to 3 days a week, once a month she cannot function Initial frequency of abortive medication: 2 to 3 days a week. Triggers: Certain smells (blueberry scent, perfume, scented candles), dairy, very cold beverages, sweets, weather Relieving factors: Rest in bed Activity: Once a month she cannot function  Past NSAIDS: ibuprofen Past analgesics: Tylenol; Goody powder Past abortive triptans: Sumatriptan 50mg  tablet (causes head pressure and feels like throat is closing), Relpax (head pressure) Past abortive ergotamine: none Past muscle relaxants: none Past anti-emetic: Zofran Past antihypertensive medications: propranolol ER 120mg  daily Past antidepressant medications: Lexapro (weight gain); Wellbutrin Past anticonvulsant medications: none Past anti-CGRP: none Past vitamins/Herbal/Supplements: none Past antihistamines/decongestants: none Other past therapies: none   Family history of headache: mother  PAST MEDICAL HISTORY: Past Medical History:  Diagnosis Date   Hypothyroidism     MEDICATIONS: Current Outpatient Medications on File Prior to Visit  Medication Sig Dispense Refill   aspirin 325 MG tablet 81 mg.      celecoxib (CELEBREX) 200 MG capsule Take 200 mg by mouth daily.     eletriptan (RELPAX) 40 MG tablet Take 1 tablet earliest onset of migraine.  May repeat in 2 hours if headache persists or recurs.  Maximum 2 tablets in 24 hours 12 tablet 3   linaclotide (LINZESS) 72 MCG capsule Linzess 72 mcg capsule     ondansetron (ZOFRAN-ODT) 4 MG disintegrating tablet DISSOLVE 1 TABLET  IN MOUTH EVERY 8 HOURS AS NEEDED FOR NAUSEA AND VOMITING 30 tablet 3   Rimegepant Sulfate (  NURTEC) 75 MG TBDP Take 75 mg by mouth daily as needed. 8 tablet 4   rosuvastatin (CRESTOR) 10 MG tablet Take 20 mg by mouth daily.     SYNTHROID 50 MCG tablet TAKE 1 TABLET BY MOUTH FOR 6 DAYS A WEEK     topiramate (TOPAMAX) 25 MG tablet 25 mg (one tablet)  at bedtime for one week, then increase to 50 mg (two tablets)  at bedtime 60 tablet 0   valACYclovir (VALTREX) 500 MG tablet Take 500 mg by mouth as needed.     No current facility-administered medications on file prior to visit.    ALLERGIES: Allergies  Allergen Reactions   Codeine    Latex     FAMILY HISTORY: No family history on file.  SOCIAL HISTORY: Social History   Socioeconomic History   Marital status: Married    Spouse name: Not on file   Number of children: Not on file   Years of education: Not on file   Highest education level: Not on file  Occupational History   Not on file  Tobacco Use   Smoking status: Never Smoker   Smokeless tobacco: Never Used  Vaping Use   Vaping Use: Never used  Substance and Sexual Activity   Alcohol use: Never   Drug use: Not on file   Sexual activity: Not on file  Other Topics Concern   Not on file  Social History Narrative   Right handed   One story home   One cup caffeine dailyl   Social Determinants of Health   Financial Resource Strain: Not on file  Food Insecurity: Not on file  Transportation Needs: Not on file  Physical Activity: Not on file  Stress: Not on file  Social Connections: Not on file  Intimate Partner Violence: Not on file     Objective:  Blood pressure 115/76, pulse 84, resp. rate 20, height 5\' 3"  (1.6 m), weight 159 lb (72.1 kg), SpO2 98 %. General: No acute distress.  Patient appears well-groomed.     Assessment/Plan:   Worsening migraine without aura, without status migrainosus, not intractable  1.  Continue topiramate, titrating  to 50mg  at bedtime.  If no improvement in 4 weeks, will increase dose, if paresthesias do not improve, will likely change to CGRP inhibitor. 2.  For migraine rescue:  Nurtec and Zofran 3.  Limit use of pain relievers to no more than 2 days out of week to prevent risk of rebound or medication-overuse headache. 4.  Keep headache diary 5.  Follow up 4 to 6 months.  , DO

## 2020-10-07 ENCOUNTER — Other Ambulatory Visit: Payer: Self-pay

## 2020-10-07 ENCOUNTER — Ambulatory Visit (INDEPENDENT_AMBULATORY_CARE_PROVIDER_SITE_OTHER): Payer: Managed Care, Other (non HMO) | Admitting: Neurology

## 2020-10-07 ENCOUNTER — Encounter: Payer: Self-pay | Admitting: Neurology

## 2020-10-07 VITALS — BP 115/76 | HR 84 | Resp 20 | Ht 63.0 in | Wt 159.0 lb

## 2020-10-07 DIAGNOSIS — G43009 Migraine without aura, not intractable, without status migrainosus: Secondary | ICD-10-CM | POA: Diagnosis not present

## 2020-10-07 MED ORDER — ONDANSETRON 4 MG PO TBDP: 4.0000 mg | ORAL_TABLET | Freq: Three times a day (TID) | ORAL | 5 refills | Status: DC | PRN

## 2020-10-07 NOTE — Patient Instructions (Signed)
  1. Continue topiramate.  In 4 weeks when you need a refill, contact me with update 2. Take Nurtec with Zofran if needed. 3. Limit use of pain relievers to no more than 2 days out of the week.  These medications include acetaminophen, NSAIDs (ibuprofen/Advil/Motrin, naproxen/Aleve, triptans (Imitrex/sumatriptan), Excedrin, and narcotics.  This will help reduce risk of rebound headaches. 4. Be aware of common food triggers:  - Caffeine:  coffee, black tea, cola, Mt. Dew  - Chocolate  - Dairy:  aged cheeses (brie, blue, cheddar, gouda, [place], provolone, [place], Swiss, etc), chocolate milk, buttermilk, sour cream, limit eggs and yogurt  - Nuts, peanut butter  - Alcohol  - Cereals/grains:  FRESH breads (fresh bagels, sourdough, doughnuts), yeast productions  - Processed/canned/aged/cured meats (pre-packaged deli meats, hotdogs)  - MSG/glutamate:  soy sauce, flavor enhancer, pickled/preserved/marinated foods  - Sweeteners:  aspartame (Equal, Nutrasweet).  Sugar and Splenda are okay  - Vegetables:  legumes (lima beans, lentils, snow peas, fava beans, pinto peans, peas, garbanzo beans), sauerkraut, onions, olives, pickles  - Fruit:  avocados, bananas, citrus fruit (orange, lemon, grapefruit), mango  - Other:  Frozen meals, macaroni and cheese 5. Routine exercise 6. Stay adequately hydrated (aim for 64 oz water daily) 7. Keep headache diary 8. Maintain proper stress management 9. Maintain proper sleep hygiene 10. Do not skip meals 11. Consider supplements:  magnesium citrate 400mg  daily, riboflavin 400mg  daily, coenzyme Q10 100mg  three times daily. 12. Follow up 4 to 6 months.

## 2020-10-10 ENCOUNTER — Ambulatory Visit: Payer: Managed Care, Other (non HMO) | Admitting: Endocrinology

## 2020-10-25 ENCOUNTER — Other Ambulatory Visit: Payer: Self-pay | Admitting: Neurology

## 2020-10-27 NOTE — Telephone Encounter (Signed)
Per last ov note,  Continue topiramate, titrating to 50mg  at bedtime.  If no improvement in 4 weeks, will increase dose, if paresthesias do not improve, will likely change to CGRP inhibitor. 2.  For migraine rescue:  Nurtec and Zofran   Per pt she did increase to the 50 mg and helps for right now. She had only two bad Miragine since increase. Will call back another 4 weeks if she needs to increase to 75 mg.   Will send in 50 mg Topiramate to the pharmacy.

## 2020-11-20 ENCOUNTER — Ambulatory Visit: Payer: Managed Care, Other (non HMO) | Admitting: Endocrinology

## 2020-12-29 ENCOUNTER — Ambulatory Visit (INDEPENDENT_AMBULATORY_CARE_PROVIDER_SITE_OTHER): Payer: Managed Care, Other (non HMO) | Admitting: Endocrinology

## 2020-12-29 ENCOUNTER — Encounter: Payer: Self-pay | Admitting: Endocrinology

## 2020-12-29 ENCOUNTER — Other Ambulatory Visit: Payer: Self-pay

## 2020-12-29 DIAGNOSIS — E039 Hypothyroidism, unspecified: Secondary | ICD-10-CM

## 2020-12-29 NOTE — Patient Instructions (Addendum)
Please continue the same Synthroid.   Please come back for a follow-up appointment in 6 months.       Hypothyroidism  Hypothyroidism is when the thyroid gland does not make enough of certain hormones (it is underactive). The thyroid gland is a small gland located in the lower front part of the neck, just in front of the windpipe (trachea). This gland makes hormones that help control how the body uses food for energy (metabolism) as well as how the heart and brain function. These hormones also play a role in keeping your bones strong. When the thyroid is underactive, it produces too little of the hormones thyroxine (T4) and triiodothyronine (T3). What are the causes? This condition may be caused by:  Hashimoto's disease. This is a disease in which the body's disease-fighting system (immune system) attacks the thyroid gland. This is the most common cause.  Viral infections.  Pregnancy.  Certain medicines.  Birth defects.  Past radiation treatments to the head or neck for cancer.  Past treatment with radioactive iodine.  Past exposure to radiation in the environment.  Past surgical removal of part or all of the thyroid.  Problems with a gland in the center of the brain (pituitary gland).  Lack of enough iodine in the diet. What increases the risk? You are more likely to develop this condition if:  You are female.  You have a family history of thyroid conditions.  You use a medicine called lithium.  You take medicines that affect the immune system (immunosuppressants). What are the signs or symptoms? Symptoms of this condition include:  Feeling as though you have no energy (lethargy).  Not being able to tolerate cold.  Weight gain that is not explained by a change in diet or exercise habits.  Lack of appetite.  Dry skin.  Coarse hair.  Menstrual irregularity.  Slowing of thought processes.  Constipation.  Sadness or depression. How is this diagnosed? This  condition may be diagnosed based on:  Your symptoms, your medical history, and a physical exam.  Blood tests. You may also have imaging tests, such as an ultrasound or MRI. How is this treated? This condition is treated with medicine that replaces the thyroid hormones that your body does not make. After you begin treatment, it may take several weeks for symptoms to go away. Follow these instructions at home:  Take over-the-counter and prescription medicines only as told by your health care provider.  If you start taking any new medicines, tell your health care provider.  Keep all follow-up visits as told by your health care provider. This is important. ? As your condition improves, your dosage of thyroid hormone medicine may change. ? You will need to have blood tests regularly so that your health care provider can monitor your condition. Contact a health care provider if:  Your symptoms do not get better with treatment.  You are taking thyroid hormone replacement medicine and you: ? Sweat a lot. ? Have tremors. ? Feel anxious. ? Lose weight rapidly. ? Cannot tolerate heat. ? Have emotional swings. ? Have diarrhea. ? Feel weak. Get help right away if you have:  Chest pain.  An irregular heartbeat.  A rapid heartbeat.  Difficulty breathing. Summary  Hypothyroidism is when the thyroid gland does not make enough of certain hormones (it is underactive).  When the thyroid is underactive, it produces too little of the hormones thyroxine (T4) and triiodothyronine (T3).  The most common cause is Hashimoto's disease, a disease in  which the body's disease-fighting system (immune system) attacks the thyroid gland. The condition can also be caused by viral infections, medicine, pregnancy, or past radiation treatment to the head or neck.  Symptoms may include weight gain, dry skin, constipation, feeling as though you do not have energy, and not being able to tolerate cold.  This  condition is treated with medicine to replace the thyroid hormones that your body does not make. This information is not intended to replace advice given to you by your health care provider. Make sure you discuss any questions you have with your health care provider. Document Revised: 07/11/2020 Document Reviewed: 06/26/2020 Elsevier Patient Education  2021 [employer].

## 2020-12-29 NOTE — Progress Notes (Signed)
Subjective:    Patient ID: Shelby Maxwell, female    DOB: [birth year], 55 y.o.   MRN: [id number]  HPI Pt is referred by Belva Agee, NP, for hypothyroidism.  Pt reports hypothyroidism was dx'ed in 2020.  she has been on Synthroid since then.  she has never taken kelp or any other type of non-prescribed thyroid product.  she has never had thyroid imaging.  She has never had thyroid surgery, or XRT to the neck.  she has never been on amiodarone or lithium.  She reports fatigue, dry skin, difficuty with concentration, constipation, and diffuse hair loss.  She has lost 15 lbs--intentional.  No recent change in Synthroid dosage.    Past Medical History:  Diagnosis Date  . Hypothyroidism     Past Surgical History:  Procedure Laterality Date  . VAGINAL HYSTERECTOMY      Social History   Socioeconomic History  . Marital status: Married    Spouse name: Not on file  . Number of children: Not on file  . Years of education: Not on file  . Highest education level: Not on file  Occupational History  . Not on file  Tobacco Use  . Smoking status: Never Smoker  . Smokeless tobacco: Never Used  Vaping Use  . Vaping Use: Never used  Substance and Sexual Activity  . Alcohol use: Never  . Drug use: Not on file  . Sexual activity: Not on file  Other Topics Concern  . Not on file  Social History Narrative   Right handed   One story home   One cup caffeine dailyl   Social Determinants of Health   Financial Resource Strain: Not on file  Food Insecurity: Not on file  Transportation Needs: Not on file  Physical Activity: Not on file  Stress: Not on file  Social Connections: Not on file  Intimate Partner Violence: Not on file    Current Outpatient Medications on File Prior to Visit  Medication Sig Dispense Refill  . aspirin 325 MG tablet 81 mg.     . linaclotide (LINZESS) 72 MCG capsule Linzess 72 mcg capsule    . ondansetron (ZOFRAN ODT) 4 MG disintegrating tablet Take 1 tablet (4 mg  total) by mouth every 8 (eight) hours as needed for nausea or vomiting. 20 tablet 5  . Rimegepant Sulfate (NURTEC) 75 MG TBDP Take 75 mg by mouth daily as needed. 8 tablet 4  . rosuvastatin (CRESTOR) 10 MG tablet Take 20 mg by mouth daily.    Marland Kitchen SYNTHROID 50 MCG tablet TAKE 1 TABLET BY MOUTH FOR 6 DAYS A WEEK    . topiramate (TOPAMAX) 50 MG tablet 1 TABLETS BY MOUTH EVERY DAY AT BEDTIME 30 tablet 2   No current facility-administered medications on file prior to visit.    Allergies  Allergen Reactions  . Codeine   . Latex     Family History  Problem Relation Age of Onset  . Thyroid disease Neg Hx     BP 130/80 (BP Location: Right Arm, Patient Position: Sitting, Cuff Size: Normal)   Pulse 88   Ht 5\' 2"  (1.575 m)   Wt 147 lb 8 oz (66.9 kg)   SpO2 97%   BMI 26.98 kg/m    Review of Systems denies depression, muscle cramps, weight gain, numbness, cold intolerance.  Migraine is well-controlled.       Objective:   Physical Exam VS: see vs page GEN: no distress HEAD: head: no deformity eyes: no periorbital swelling,  no proptosis external nose and ears are normal NECK: supple, thyroid is not enlarged CHEST WALL: no deformity LUNGS: clear to auscultation CV: reg rate and rhythm, no murmur.  MUSCULOSKELETAL: gait is normal and steady EXTEMITIES: no deformity.  no leg edema NEURO:  readily moves all 4's.  sensation is intact to touch on all 4's SKIN:  Normal texture and temperature.  No rash or suspicious lesion is visible.   NODES:  None palpable at the neck PSYCH: alert, well-oriented.  Does not appear anxious nor depressed.   outside test results are reviewed: TSH=2.6 (7/21)  Pt signs release of info from [facility].        Assessment & Plan:  Hypothyroidism: well-replaced.   Fatigue and other sxs: we discussed.  Not thyroid-related  Patient Instructions  Please continue the same Synthroid.   Please come back for a follow-up appointment in 6 months.        Hypothyroidism  Hypothyroidism is when the thyroid gland does not make enough of certain hormones (it is underactive). The thyroid gland is a small gland located in the lower front part of the neck, just in front of the windpipe (trachea). This gland makes hormones that help control how the body uses food for energy (metabolism) as well as how the heart and brain function. These hormones also play a role in keeping your bones strong. When the thyroid is underactive, it produces too little of the hormones thyroxine (T4) and triiodothyronine (T3). What are the causes? This condition may be caused by:  Hashimoto's disease. This is a disease in which the body's disease-fighting system (immune system) attacks the thyroid gland. This is the most common cause.  Viral infections.  Pregnancy.  Certain medicines.  Birth defects.  Past radiation treatments to the head or neck for cancer.  Past treatment with radioactive iodine.  Past exposure to radiation in the environment.  Past surgical removal of part or all of the thyroid.  Problems with a gland in the center of the brain (pituitary gland).  Lack of enough iodine in the diet. What increases the risk? You are more likely to develop this condition if:  You are female.  You have a family history of thyroid conditions.  You use a medicine called lithium.  You take medicines that affect the immune system (immunosuppressants). What are the signs or symptoms? Symptoms of this condition include:  Feeling as though you have no energy (lethargy).  Not being able to tolerate cold.  Weight gain that is not explained by a change in diet or exercise habits.  Lack of appetite.  Dry skin.  Coarse hair.  Menstrual irregularity.  Slowing of thought processes.  Constipation.  Sadness or depression. How is this diagnosed? This condition may be diagnosed based on:  Your symptoms, your medical history, and a physical  exam.  Blood tests. You may also have imaging tests, such as an ultrasound or MRI. How is this treated? This condition is treated with medicine that replaces the thyroid hormones that your body does not make. After you begin treatment, it may take several weeks for symptoms to go away. Follow these instructions at home:  Take over-the-counter and prescription medicines only as told by your health care provider.  If you start taking any new medicines, tell your health care provider.  Keep all follow-up visits as told by your health care provider. This is important. ? As your condition improves, your dosage of thyroid hormone medicine may change. ? You  will need to have blood tests regularly so that your health care provider can monitor your condition. Contact a health care provider if:  Your symptoms do not get better with treatment.  You are taking thyroid hormone replacement medicine and you: ? Sweat a lot. ? Have tremors. ? Feel anxious. ? Lose weight rapidly. ? Cannot tolerate heat. ? Have emotional swings. ? Have diarrhea. ? Feel weak. Get help right away if you have:  Chest pain.  An irregular heartbeat.  A rapid heartbeat.  Difficulty breathing. Summary  Hypothyroidism is when the thyroid gland does not make enough of certain hormones (it is underactive).  When the thyroid is underactive, it produces too little of the hormones thyroxine (T4) and triiodothyronine (T3).  The most common cause is Hashimoto's disease, a disease in which the body's disease-fighting system (immune system) attacks the thyroid gland. The condition can also be caused by viral infections, medicine, pregnancy, or past radiation treatment to the head or neck.  Symptoms may include weight gain, dry skin, constipation, feeling as though you do not have energy, and not being able to tolerate cold.  This condition is treated with medicine to replace the thyroid hormones that your body does not  make. This information is not intended to replace advice given to you by your health care provider. Make sure you discuss any questions you have with your health care provider. Document Revised: 07/11/2020 Document Reviewed: 06/26/2020 Elsevier Patient Education  2021 [employer].

## 2020-12-31 DIAGNOSIS — E039 Hypothyroidism, unspecified: Secondary | ICD-10-CM | POA: Insufficient documentation

## 2021-01-12 ENCOUNTER — Other Ambulatory Visit: Payer: Self-pay | Admitting: Neurology

## 2021-01-28 ENCOUNTER — Ambulatory Visit: Payer: Managed Care, Other (non HMO) | Admitting: Neurology

## 2021-02-04 ENCOUNTER — Other Ambulatory Visit: Payer: Self-pay | Admitting: Neurology

## 2021-02-05 ENCOUNTER — Other Ambulatory Visit: Payer: Self-pay

## 2021-02-05 MED ORDER — TOPIRAMATE 50 MG PO TABS: ORAL_TABLET | ORAL | 0 refills | Status: DC

## 2021-02-05 NOTE — Progress Notes (Signed)
Per pt refill for topiramate sent to the pharmacy to last til her appt in June.

## 2021-02-27 ENCOUNTER — Other Ambulatory Visit: Payer: Self-pay | Admitting: Neurology

## 2021-03-05 ENCOUNTER — Other Ambulatory Visit: Payer: Self-pay

## 2021-03-05 DIAGNOSIS — R519 Headache, unspecified: Secondary | ICD-10-CM

## 2021-03-05 NOTE — Progress Notes (Signed)
We can check a CT of the head for worsening headaches. In the meantime, increase topiramate to 100mg  at bedtime

## 2021-03-12 ENCOUNTER — Other Ambulatory Visit: Payer: Self-pay

## 2021-03-12 ENCOUNTER — Telehealth: Payer: Self-pay | Admitting: Neurology

## 2021-03-12 DIAGNOSIS — R519 Headache, unspecified: Secondary | ICD-10-CM

## 2021-03-12 DIAGNOSIS — G43009 Migraine without aura, not intractable, without status migrainosus: Secondary | ICD-10-CM

## 2021-03-12 NOTE — Telephone Encounter (Signed)
New message    Patient stated [facility] does not do that type of CT order that's written by MD.   Appt is pending   Please advise

## 2021-03-13 ENCOUNTER — Other Ambulatory Visit: Payer: Self-pay | Admitting: Neurology

## 2021-03-13 DIAGNOSIS — R519 Headache, unspecified: Secondary | ICD-10-CM

## 2021-03-13 DIAGNOSIS — G43009 Migraine without aura, not intractable, without status migrainosus: Secondary | ICD-10-CM

## 2021-04-01 NOTE — Progress Notes (Signed)
NEUROLOGY FOLLOW UP OFFICE NOTE  Shelby Maxwell [id number]  Assessment/Plan:   Migraine without aura - possibly aggravated by her cracked crown   1.  Migraine prevention:  Due to side effects, decrease topiramate to 50mg  at bedtime.  If headaches do not improved 4 weeks after the procedure, plan would be to switch from topiramate to a CGRP inhibitor.  If headaches have drastically improved, will discontinue topiramate and see how she does off of a preventative medication 2.  Migraine rescue:  Will have her try Ubrelvy 100mg .  Zofran for nausea 3.  Limit use of pain relievers to no more than 2 days out of week to prevent risk of rebound or medication-overuse headache. 4.  Keep headache diary 5.  Follow up on CT head 6.  Follow up 6 months.  Subjective:  Shelby Maxwell is a 55 year old female with Hashimoto's thyroiditis who follows up for migraine.   UPDATE: Since last visit, topiramate has steadily been titrated up, now on 100mg  at bedtime.  Due to worsening headaches, a CT head was ordered but has not been scheduled until later today.  Since increased in topiramate, notes metallic taste in mouth, paresthesias, rashes.  The increased dose has not helped.   She went to the dentist and found out that her crown on the right is cracked.  Does not tooth pain.  Scheduled to get it extracted next week. Intensity:  moderate to severe Duration:  Nurtec varies.  Lasts 1 day (sometimes 2 days. Frequency:  11 headaches in last 30 days Rescue therapy:  Nurtec and Zofran.  Rarely has taken with [place] (3 times) Current NSAIDS:  ASA 81mg  Current analgesics:  none Current triptans:  none Current ergotamine:  none Current anti-emetic:  Zofran ODT 4mg  Current muscle relaxants:  none Current anti-anxiolytic:  none Current sleep aide:  none Current Antihypertensive medications:  none Current Antidepressant medications:  none Current Anticonvulsant medications:  topiramate 50mg  BID Current  anti-CGRP:  Nurtec (abortive) Current Vitamins/Herbal/Supplements:  D Current Antihistamines/Decongestants:  none Other therapy:  cold compress for mild headaches. Hormone/birth control:  none   Caffeine:  1 cup of coffee daily Diet:  Drinks 2-3 16 oz bottles of water daily.  Tries to eat healthy.  Eats 3 meals a day.  No soda Exercise:  no Depression:  controlled; Anxiety:  Controlled.  She has a child with ADHD and autism Other pain:  Left sided neck pain.  Wakes up every morning with neck pain.   Sleep hygiene:  Ok (6-8 hours a night) Works on computer 9 hours a day.     HISTORY:  Onset:  In her 51s.  They started becoming more frequent in 2020. Location:  Mostly behind right eye, frontal but sometimes base of neck. Quality:  Pressure, throbbing, pounding, squeezing Initial intensity:  8/10.  She denies new headache, thunderclap headache Aura:  no Premonitory Phase:  no Postdrome:  no Associated symptoms:  Nausea, photophobia, phonophobia, osmophobia.  She denies associated visual disturbance, vomiting, unilateral numbness or weakness. Initial duration:  She often wakes up with them.  8 to 10 hours.  Severe once last 24 hours Initial frequency:  2 to 3 days a week, once a month she cannot function Initial frequency of abortive medication: 2 to 3 days a week. Triggers:  Certain smells (blueberry scent, perfume, scented candles), dairy, very cold beverages, sweets, weather Relieving factors:  Rest in bed Activity:  Once a month she cannot function   Past NSAIDS:  ibuprofen Past analgesics:  Tylenol; Goody powder Past abortive triptans:  Sumatriptan 50mg  tablet (causes head pressure and feels like throat is closing), Relpax (head pressure) Past abortive ergotamine:  none Past muscle relaxants:  none Past anti-emetic:  Zofran Past antihypertensive medications:  propranolol ER 120mg  daily Past antidepressant medications:  Lexapro (weight gain); Wellbutrin Past anticonvulsant  medications:  none Past anti-CGRP:  none Past vitamins/Herbal/Supplements:  none Past antihistamines/decongestants:  none Other past therapies:  none     Family history of headache:  mother  PAST MEDICAL HISTORY: Past Medical History:  Diagnosis Date   Hypothyroidism     MEDICATIONS: Current Outpatient Medications on File Prior to Visit  Medication Sig Dispense Refill   aspirin 325 MG tablet 81 mg.      linaclotide (LINZESS) 72 MCG capsule Linzess 72 mcg capsule     ondansetron (ZOFRAN ODT) 4 MG disintegrating tablet Take 1 tablet (4 mg total) by mouth every 8 (eight) hours as needed for nausea or vomiting. 20 tablet 5   Rimegepant Sulfate (NURTEC) 75 MG TBDP Take 75 mg by mouth daily as needed. 8 tablet 4   rosuvastatin (CRESTOR) 10 MG tablet Take 20 mg by mouth daily.     SYNTHROID 50 MCG tablet TAKE 1 TABLET BY MOUTH FOR 6 DAYS A WEEK     topiramate (TOPAMAX) 50 MG tablet TAKE 1 TABLET BY MOUTH EVERY DAY AT BEDTIME 90 tablet 0   No current facility-administered medications on file prior to visit.    ALLERGIES: Allergies  Allergen Reactions   Codeine    Latex     FAMILY HISTORY: Family History  Problem Relation Age of Onset   Thyroid disease Neg Hx       Objective:  Blood pressure 121/80, pulse 83, height 5\' 3"  (1.6 m), weight 146 lb (66.2 kg), SpO2 99 %. General: No acute distress.  Patient appears well-groomed.    , DO  CC: , DO

## 2021-04-03 ENCOUNTER — Ambulatory Visit (INDEPENDENT_AMBULATORY_CARE_PROVIDER_SITE_OTHER): Payer: Managed Care, Other (non HMO) | Admitting: Neurology

## 2021-04-03 ENCOUNTER — Other Ambulatory Visit: Payer: Self-pay

## 2021-04-03 ENCOUNTER — Encounter: Payer: Self-pay | Admitting: Neurology

## 2021-04-03 ENCOUNTER — Ambulatory Visit
Admission: RE | Admit: 2021-04-03 | Discharge: 2021-04-03 | Disposition: A | Discharge status: Home / Self Care | Payer: Managed Care, Other (non HMO) | Source: Ambulatory Visit | Attending: Neurology | Admitting: Neurology

## 2021-04-03 VITALS — BP 121/80 | HR 83 | Ht 63.0 in | Wt 146.0 lb

## 2021-04-03 DIAGNOSIS — G43009 Migraine without aura, not intractable, without status migrainosus: Secondary | ICD-10-CM

## 2021-04-03 DIAGNOSIS — R519 Headache, unspecified: Secondary | ICD-10-CM

## 2021-04-03 NOTE — Patient Instructions (Signed)
Decrease topiramate to 50mg  at bedtime.  If headaches  not improved in 5 weeks, contact me and we will start a different migraine medication.  If headaches are improved, then we will just stop topiramate Try - take on tablet earliest onset of migraine.  May repeat in 2 hours.  Maximum 2 in 24 hours.  If effective, contact me for prescription.  Use zofran as needed Limit use of pain relievers to no more than 2 days out of week to prevent risk of rebound or medication-overuse headache. Keep headache diary Will contact you with CT results Follow up 6 months.

## 2021-04-14 ENCOUNTER — Other Ambulatory Visit: Payer: Self-pay | Admitting: Neurology

## 2021-05-24 ENCOUNTER — Other Ambulatory Visit: Payer: Self-pay | Admitting: Neurology

## 2021-06-30 LAB — COLOGUARD: COLOGUARD: NEGATIVE

## 2021-07-01 ENCOUNTER — Ambulatory Visit: Payer: Managed Care, Other (non HMO) | Admitting: Endocrinology

## 2021-07-23 ENCOUNTER — Encounter: Payer: Self-pay | Admitting: Neurology

## 2021-07-23 ENCOUNTER — Other Ambulatory Visit: Payer: Self-pay

## 2021-07-23 ENCOUNTER — Ambulatory Visit (INDEPENDENT_AMBULATORY_CARE_PROVIDER_SITE_OTHER): Payer: Managed Care, Other (non HMO) | Admitting: Neurology

## 2021-07-23 ENCOUNTER — Telehealth: Payer: Self-pay

## 2021-07-23 VITALS — BP 126/76 | HR 78 | Ht 62.0 in | Wt 148.0 lb

## 2021-07-23 DIAGNOSIS — G43009 Migraine without aura, not intractable, without status migrainosus: Secondary | ICD-10-CM

## 2021-07-23 MED ORDER — ELYXYB 120 MG/4.8ML PO SOLN: 120.0000 mg | Freq: Every day | ORAL | 5 refills | Status: DC | PRN

## 2021-07-23 MED ORDER — EMGALITY 120 MG/ML ~~LOC~~ SOAJ: 240.0000 mg | Freq: Once | SUBCUTANEOUS | 0 refills | Status: AC

## 2021-07-23 MED ORDER — EMGALITY 120 MG/ML ~~LOC~~ SOAJ: 120.0000 mg | SUBCUTANEOUS | 5 refills | Status: DC

## 2021-07-23 NOTE — Progress Notes (Signed)
NEUROLOGY FOLLOW UP OFFICE NOTE  Shelby Maxwell [id number]  Assessment/Plan:   Migraine without aura, without status migrainosus, not intractable  Migraine prevention:  Start Emgality.  Once she takes her first dose, discontinue topiramate Migraine rescue:  Will have her take Elyxyb with Nurtec at earliest onset of migraine.  Zofran for nausea Limit use of pain relievers to no more than 2 days out of week to prevent risk of rebound or medication-overuse headache. Keep headache diary Follow up 6 months.   Subjective:  Shelby Maxwell is a 55 year old female with Hashimoto's thyroiditis who follows up for migraine.   UPDATE: CT head on 04/03/2021 personally reviewed was normal.  Due to side effects, topiramate was decreased to 50mg  at bedtime.  She underwent extraction of her cracked crown but it didn't help.  She tried but it was ineffective. Intensity:  moderate to severe Duration:  Nurtec varies.  Lasts 1 day (sometimes 2 days. Frequency:  8 headaches in last 30 days (3 occurred in middle of the night) Rescue therapy:  Nurtec and Zofran.  Rarely has taken with [place] (2 times) Current NSAIDS:  ASA 81mg  Current analgesics:  none Current triptans:  none Current ergotamine:  none Current anti-emetic:  Zofran ODT 4mg  Current muscle relaxants:  none Current anti-anxiolytic:  none Current sleep aide:  none Current Antihypertensive medications:  none Current Antidepressant medications:  none Current Anticonvulsant medications:  topiramate 50mg  QHS Current anti-CGRP:  Nurtec (abortive), Ubrelvy 100mg  Current Vitamins/Herbal/Supplements:  D Current Antihistamines/Decongestants:  none Other therapy:  cold compress for mild headaches. Hormone/birth control:  none   Caffeine:  1 cup of coffee daily Diet:  Drinks 2-3 16 oz bottles of water daily.  Tries to eat healthy.  Eats 3 meals a day.  No soda Exercise:  no Depression:  controlled; Anxiety:  Controlled.  She has a  child with ADHD and autism Other pain:  Left sided neck pain.  Wakes up every morning with neck pain.   Sleep hygiene:  Ok (6-8 hours a night) Works on computer 9 hours a day.     HISTORY:  Onset:  In her 2s.  They started becoming more frequent in 2020. Location:  Mostly behind right eye, frontal but sometimes base of neck. Quality:  Pressure, throbbing, pounding, squeezing Initial intensity:  8/10.  She denies new headache, thunderclap headache Aura:  no Premonitory Phase:  no Postdrome:  no Associated symptoms:  Nausea, photophobia, phonophobia, osmophobia.  She denies associated visual disturbance, vomiting, unilateral numbness or weakness. Initial duration:  She often wakes up with them.  8 to 10 hours.  Severe once last 24 hours Initial frequency:  2 to 3 days a week, once a month she cannot function Initial frequency of abortive medication: 2 to 3 days a week. Triggers:  Certain smells (blueberry scent, perfume, scented candles), dairy, very cold beverages, sweets, weather Relieving factors:  Rest in bed Activity:  Once a month she cannot function   Past NSAIDS:  ibuprofen Past analgesics:  Tylenol; Goody powder Past abortive triptans:  Sumatriptan 50mg  tablet (causes head pressure and feels like throat is closing), Relpax (head pressure) Past abortive ergotamine:  none Past muscle relaxants:  none Past anti-emetic:  Zofran Past antihypertensive medications:  propranolol ER 120mg  daily Past antidepressant medications:  Lexapro (weight gain); Wellbutrin Past anticonvulsant medications:  none Past anti-CGRP:  none Past vitamins/Herbal/Supplements:  none Past antihistamines/decongestants:  none Other past therapies:  none     Family history of  headache:  mother  PAST MEDICAL HISTORY: Past Medical History:  Diagnosis Date   Hypothyroidism     MEDICATIONS: Current Outpatient Medications on File Prior to Visit  Medication Sig Dispense Refill   aspirin 325 MG tablet 81  mg.      ondansetron (ZOFRAN-ODT) 4 MG disintegrating tablet DISSOLVE 1 TABLET BY MOUTH EVERY 8 HOURS AS NEEDED FOR NAUSEA OR VOMITING 20 tablet 5   Rimegepant Sulfate (NURTEC) 75 MG TBDP Take 75 mg by mouth daily as needed. 8 tablet 4   rosuvastatin (CRESTOR) 10 MG tablet Take 20 mg by mouth daily.     SYNTHROID 50 MCG tablet TAKE 1 TABLET BY MOUTH FOR 6 DAYS A WEEK     topiramate (TOPAMAX) 50 MG tablet TAKE 1 TABLET BY MOUTH EVERY DAY AT BEDTIME 90 tablet 0   No current facility-administered medications on file prior to visit.    ALLERGIES: Allergies  Allergen Reactions   Codeine    Latex     FAMILY HISTORY: Family History  Problem Relation Age of Onset   Thyroid disease Neg Hx       Objective:  Blood pressure 126/76, pulse 78, height 5\' 2"  (1.575 m), weight 148 lb (67.1 kg), SpO2 100 %. General: No acute distress.  Patient appears well-groomed.     , DO  CC: Shon Millet, DO

## 2021-07-23 NOTE — Telephone Encounter (Signed)
New message   Shelby Maxwell Key: [id number] - PA Case ID: [id number] - Rx #: [id number] Need help? Call us at [phone] Status Sent to [facility] Elyxyb 120MG /4.8ML solution Form [employer] Electronic PA Form [phone] NCPDP) Original Claim Info 7

## 2021-07-23 NOTE — Patient Instructions (Signed)
Plan is to start Emgality.  First dose requires 2 injections but then just 1 injection every 28 days. Once you take your first dose of the injection, then stop topiramate At earliest onset of migraine, take Nurtec with Elyxyb (maximum one dose in 24 hours).  If effective, will put in a prescription.  Zofran for nausea Limit use of pain relievers to no more than 2 days out of week to prevent risk of rebound or medication-overuse headache. Keep headache diary. Follow up 6 months.

## 2021-07-27 NOTE — Telephone Encounter (Signed)
F/u  Outcome Approved on October 2  Your PA request has been approved. Additional information will be provided in the approval communication. (Message 1145)  Drug Elyxyb 120MG /4.8ML solution  [employer] Electronic PA Form [phone] NCPDP)

## 2021-07-31 ENCOUNTER — Telehealth: Payer: Self-pay | Admitting: Neurology

## 2021-07-31 NOTE — Telephone Encounter (Signed)
LMVOM, I will ask the PA Coordinator to see if we have gotten a PA approval form [facility]. If pt needs samples or a Copay card we can give that to her.

## 2021-08-03 NOTE — Telephone Encounter (Signed)
F/u   The patient called on Friday, July 31, 2021, with a phone number to [facility] [phone] on medication Emgality120 mg.   I called per the patient request the recording voiced it was a high call volume to call back.

## 2021-08-05 NOTE — Telephone Encounter (Signed)
F/u   Call  [facility] at [phone] Spoke with a rep who did a test claim, the patient does not require Prior Authorization.   The pharmacy can refill medication on 10.25.2022    The patient is notified at  [phone] and has questions regarding loading dose, message route to the nurse for clarification

## 2021-08-06 ENCOUNTER — Other Ambulatory Visit: Payer: Self-pay | Admitting: Neurology

## 2021-08-06 ENCOUNTER — Other Ambulatory Visit: Payer: Self-pay

## 2021-08-06 NOTE — Telephone Encounter (Signed)
Pt advised to come by the office to pick up samples per Dr.Jaffe.

## 2021-08-13 ENCOUNTER — Telehealth: Payer: Self-pay

## 2021-08-13 NOTE — Telephone Encounter (Signed)
Pt stopped by to pick up Emgality.  Per pt she wants to hold off using  it right now. Per pt she increased her Topiramate to 100 mg a couple weeks ago and it has helped her. She only had two Migraines since.

## 2021-09-05 ENCOUNTER — Other Ambulatory Visit: Payer: Self-pay | Admitting: Neurology

## 2021-10-05 ENCOUNTER — Other Ambulatory Visit: Payer: Self-pay | Admitting: Neurology

## 2021-10-22 ENCOUNTER — Ambulatory Visit: Payer: Managed Care, Other (non HMO) | Admitting: Neurology

## 2021-11-13 ENCOUNTER — Other Ambulatory Visit: Payer: Self-pay | Admitting: Neurology

## 2021-11-19 ENCOUNTER — Other Ambulatory Visit: Payer: Self-pay | Admitting: Neurology

## 2021-11-26 ENCOUNTER — Other Ambulatory Visit: Payer: Self-pay | Admitting: Neurology

## 2022-01-15 ENCOUNTER — Other Ambulatory Visit: Payer: Self-pay | Admitting: Neurology

## 2022-01-31 ENCOUNTER — Other Ambulatory Visit: Payer: Self-pay | Admitting: Neurology

## 2022-02-01 NOTE — Progress Notes (Signed)
? ?Virtual Visit via Video Note ?The purpose of this virtual visit is to provide medical care while limiting exposure to the novel coronavirus.   ? ?Consent was obtained for video visit:  Yes.   ?Answered questions that patient had about telehealth interaction:  Yes.   ?I discussed the limitations, risks, security and privacy concerns of performing an evaluation and management service by telemedicine. I also discussed with the patient that there may be a patient responsible charge related to this service. The patient expressed understanding and agreed to proceed. ? ?Pt location: Home ?Physician Location: office ?Name of referring provider:  Lorelei Pont, DO ?I connected with Shelby Maxwell at patients initiation/request on 02/02/2022 at 10:10 AM EDT by video enabled telemedicine application and verified that I am speaking with the correct person using two identifiers. ?Pt MRN:  [id number] ?Pt DOB:  July 06, 1966 ?Video Participants:  Shelby Maxwell ? ? ?Assessment/Plan:  ? ?Migraine without aura, without status migrainosus, not intractable ?  ?Migraine prevention:  Will again try Aimovig 140mg  every 28 days.  May have better coverage this year.  Decrease topiramate back to 50mg  at bedtime ?Migraine rescue:  Trudhesa NS.  May use Nurtec if needed.  Zofran for nausea ?Limit use of pain relievers to no more than 2 days out of week to prevent risk of rebound or medication-overuse headache. ?Keep headache diary ?Follow up 4-5 months. ?  ?  ?Subjective:  ?Shelby Maxwell is a 56 year old female with Hashimoto's thyroiditis who follows up for migraine. ?  ?UPDATE: ?At end of September, plan was to discontinue topiramate and start Emgality.  Ultimately, held off on Emgality and increased topiramate to 100mg  at bedtime. ?Intensity:  moderate to severe ?Duration:  Nurtec varies.  Lasts 1 day (sometimes 2 days). ?Frequency:  Varies:  Nov 10, Dec 2, Jan 2, Feb 10, March 9. In Jan-Feb, she saw the chiropractor which may have  helped.   ?Rescue therapy:  Nurtec and Zofran.  Rarely has taken with 05-04-2005 (2 times) ?Current NSAIDS:  ASA 81mg  ?Current analgesics:  none ?Current triptans:  none ?Current ergotamine:  none ?Current anti-emetic:  Zofran ODT 4mg  ?Current muscle relaxants:  none ?Current anti-anxiolytic:  none ?Current sleep aide:  none ?Current Antihypertensive medications:  none ?Current Antidepressant medications:  none ?Current Anticonvulsant medications: topiramate 100mg  QHS ?Current anti-CGRP:  Nurtec (abortive) ?Current Vitamins/Herbal/Supplements:  D ?Current Antihistamines/Decongestants:  none ?Other therapy:  cold compress for mild headaches. ?Hormone/birth control:  none ?  ?Caffeine:  1 cup of coffee daily ?Diet:  Drinks 2-3 16 oz bottles of water daily.  Tries to eat healthy.  Eats 3 meals a day.  No soda ?Exercise:  no ?Depression:  controlled; Anxiety:  Controlled.  She has a child with ADHD and autism ?Other pain:  Left sided neck pain.  Wakes up every morning with neck pain.   ?Sleep hygiene:  Ok (6-8 hours a night) ?Works on computer 9 hours a day.   ?  ?HISTORY:  ?Onset:  In her 23s.  They started becoming more frequent in 2020. ?Location:  Mostly behind right eye, frontal but sometimes base of neck. ?Quality:  Pressure, throbbing, pounding, squeezing ?Initial intensity:  8/10.  She denies new headache, thunderclap headache ?Aura:  no ?Premonitory Phase:  no ?Postdrome:  no ?Associated symptoms:  Nausea, photophobia, phonophobia, osmophobia.  She denies associated visual disturbance, vomiting, unilateral numbness or weakness. ?Initial duration:  She often wakes up with them.  8 to 10 hours.  Severe once  last 24 hours ?Initial frequency:  2 to 3 days a week, once a month she cannot function ?Initial frequency of abortive medication: 2 to 3 days a week. ?Triggers:  Certain smells (blueberry scent, perfume, scented candles), dairy, very cold beverages, sweets, weather ?Relieving factors:  Rest in bed ?Activity:  Once  a month she cannot function ? ?CT head on 04/03/2021 was normal. ?  ?Past NSAIDS:  ibuprofen, Elyxyb (discontinued) ?Past analgesics:  Tylenol; Goody powder ?Past abortive triptans:  Sumatriptan 50mg  tablet (causes head pressure and feels like throat is closing), Relpax (head pressure) ?Past abortive ergotamine:  none ?Past muscle relaxants:  none ?Past anti-emetic:  Zofran ?Past antihypertensive medications:  propranolol ER 120mg  daily ?Past antidepressant medications:  Lexapro (weight gain); Wellbutrin ?Past anticonvulsant medications: none ?Past anti-CGRP:  100mg  ?Past vitamins/Herbal/Supplements:  none ?Past antihistamines/decongestants:  none ?Other past therapies:  chiropractor ?  ?  ?Family history of headache:  mother ? ?Past Medical History: ?Past Medical History:  ?Diagnosis Date  ? Hypothyroidism   ? ? ?Medications: ?Outpatient Encounter Medications as of 02/02/2022  ?Medication Sig  ? aspirin 325 MG tablet 81 mg. 81 mg  ? Dihydroergotamine Mesylate HFA (TRUDHESA) 0.725 MG/ACT AERS Place 0.725 mg into the nose as needed (1 spray in each nostril.  May repeat once after one hour (Maximum 4 total sprays in 24 hours)).  ? Erenumab-aooe (AIMOVIG) 140 MG/ML SOAJ Inject 140 mg into the skin every 28 (twenty-eight) days.  ? NURTEC 75 MG TBDP TAKE 1 TABLET BY MOUTH EVERY DAY AS NEEDED  ? ondansetron (ZOFRAN-ODT) 4 MG disintegrating tablet DISSOLVE 1 TABLET BY MOUTH EVERY 8 HOURS AS NEEDED FOR NAUSEA OR VOMITING  ? rosuvastatin (CRESTOR) 10 MG tablet Take 20 mg by mouth daily.  ? SYNTHROID 50 MCG tablet TAKE 1 TABLET BY MOUTH FOR 6 DAYS A WEEK  ? topiramate (TOPAMAX) 100 MG tablet TAKE 1 TABLET BY MOUTH EVERYDAY AT BEDTIME  ? [DISCONTINUED] ELYXYB 120 MG/4.8ML SOLN TAKE 120 MG/4.8ML BY MOUTH DAILY AS NEEDED AS DIRECTED  ? [DISCONTINUED] Galcanezumab-gnlm (EMGALITY) 120 MG/ML SOAJ Inject 120 mg into the skin every 28 (twenty-eight) days.  ? ?No facility-administered encounter medications on file as of  02/02/2022.  ? ? ?Allergies: ?Allergies  ?Allergen Reactions  ? Codeine   ? Latex   ? ? ?Family History: ?Family History  ?Problem Relation Age of Onset  ? Thyroid disease Neg Hx   ? ? ?Observations/Objective:   ?No acute distress.  Alert and oriented.  Speech fluent and not dysarthric.  Language intact.   ? ? ?Follow Up Instructions: ?  ? -I discussed the assessment and treatment plan with the patient. The patient was provided an opportunity to ask questions and all were answered. The patient agreed with the plan and demonstrated an understanding of the instructions. ?  ?The patient was advised to call back or seek an in-person evaluation if the symptoms worsen or if the condition fails to improve as anticipated. ? ? ? , DO ? ?

## 2022-02-02 ENCOUNTER — Telehealth (INDEPENDENT_AMBULATORY_CARE_PROVIDER_SITE_OTHER): Payer: Managed Care, Other (non HMO) | Admitting: Neurology

## 2022-02-02 ENCOUNTER — Encounter: Payer: Self-pay | Admitting: Neurology

## 2022-02-02 VITALS — Ht 63.0 in | Wt 125.0 lb

## 2022-02-02 DIAGNOSIS — G43009 Migraine without aura, not intractable, without status migrainosus: Secondary | ICD-10-CM | POA: Diagnosis not present

## 2022-02-02 MED ORDER — TRUDHESA 0.725 MG/ACT NA AERS: 0.7250 mg | INHALATION_SPRAY | NASAL | 5 refills | Status: DC | PRN

## 2022-02-02 MED ORDER — AIMOVIG 140 MG/ML ~~LOC~~ SOAJ: 140.0000 mg | SUBCUTANEOUS | 5 refills | Status: DC

## 2022-02-02 NOTE — Patient Instructions (Addendum)
Start Aimovig 140mg  every 28 days  Decrease topiramate back to 50mg  at bedtime ?Take Trudhesa nasal spray as directed.  May still use nurtec.  Zofran for nausea ?Limit use of pain relievers to no more than 2 days out of week to prevent risk of rebound or medication-overuse headache. ?Keep headache diary ?Follow up 4-5 months.Marland Kitchen  ?

## 2022-02-22 ENCOUNTER — Other Ambulatory Visit: Payer: Self-pay | Admitting: Neurology

## 2022-04-14 ENCOUNTER — Encounter: Payer: Self-pay | Admitting: Neurology

## 2022-04-14 ENCOUNTER — Other Ambulatory Visit: Payer: Self-pay | Admitting: Neurology

## 2022-04-14 ENCOUNTER — Telehealth: Payer: Self-pay

## 2022-04-14 MED ORDER — TOPIRAMATE 100 MG PO TABS: ORAL_TABLET | ORAL | 0 refills | Status: DC

## 2022-04-14 NOTE — Telephone Encounter (Signed)
Patient advised per Dr.Jaffe,Then she may try increasing the topiramate again to 100mg  at bedtime.  It may be more effective together with the Aimovig.  Topiramate 100 mg sent to [facility]

## 2022-04-14 NOTE — Telephone Encounter (Signed)
Telephone call to patient, Per patient she takes the Nurtec as needed along with the nasal Spray. If that doesn't;t work she will take [place] powder and Advil.   In the last month they have increase in frequency. Patient did notice that sometimes she has pain at the base of her head, some blurred vision during her migraines.  Please advise.

## 2022-05-11 ENCOUNTER — Other Ambulatory Visit: Payer: Self-pay | Admitting: Neurology

## 2022-06-24 ENCOUNTER — Other Ambulatory Visit: Payer: Self-pay | Admitting: Neurology

## 2022-06-29 NOTE — Progress Notes (Deleted)
NEUROLOGY FOLLOW UP OFFICE NOTE  Shelby Maxwell [id number]  Assessment/Plan:   Migraine without aura, without status migrainosus, not intractable   Migraine prevention:  Will again try Aimovig 140mg  every 28 days.  May have better coverage this year.  Decrease topiramate back to 50mg  at bedtime Migraine rescue:  Trudhesa NS.  May use Nurtec if needed.  Zofran for nausea Limit use of pain relievers to no more than 2 days out of week to prevent risk of rebound or medication-overuse headache. Keep headache diary Follow up 4-5 months.     Subjective:  Shelby Maxwell is a 56 year old female with Hashimoto's thyroiditis who follows up for migraine.   UPDATE: Started Aimovig.  ***  Intensity:  moderate to severe Duration:  Nurtec varies.  Lasts 1 day (sometimes 2 days). Frequency:  Varies:  Nov 10, Dec 2, Jan 2, Feb 10, March 9. In Jan-Feb, she saw the chiropractor which may have helped.   Rescue therapy:  Nurtec and Zofran, Trudhesa NS second line. Current NSAIDS:  ASA 81mg  Current analgesics:  none Current triptans:  none Current ergotamine:  Trudhesa NS Current anti-emetic:  Zofran ODT 4mg  Current muscle relaxants:  none Current anti-anxiolytic:  none Current sleep aide:  none Current Antihypertensive medications:  none Current Antidepressant medications:  none Current Anticonvulsant medications: topiramate 100mg  QHS Current anti-CGRP:  Nurtec (abortive), Aimovig 140mg  Current Vitamins/Herbal/Supplements:  D Current Antihistamines/Decongestants:  none Other therapy:  cold compress for mild headaches. Hormone/birth control:  none   Caffeine:  1 cup of coffee daily Diet:  Drinks 2-3 16 oz bottles of water daily.  Tries to eat healthy.  Eats 3 meals a day.  No soda Exercise:  no Depression:  controlled; Anxiety:  Controlled.  She has a child with ADHD and autism Other pain:  Left sided neck pain.  Wakes up every morning with neck pain.   Sleep hygiene:  Ok (6-8 hours a  night) Works on computer 9 hours a day.     HISTORY:  Onset:  In her 65s.  They started becoming more frequent in 2020. Location:  Mostly behind right eye, frontal but sometimes base of neck. Quality:  Pressure, throbbing, pounding, squeezing Initial intensity:  8/10.  She denies new headache, thunderclap headache Aura:  no Premonitory Phase:  no Postdrome:  no Associated symptoms:  Nausea, photophobia, phonophobia, osmophobia.  She denies associated visual disturbance, vomiting, unilateral numbness or weakness. Initial duration:  She often wakes up with them.  8 to 10 hours.  Severe once last 24 hours Initial frequency:  2 to 3 days a week, once a month she cannot function Initial frequency of abortive medication: 2 to 3 days a week. Triggers:  Certain smells (blueberry scent, perfume, scented candles), dairy, very cold beverages, sweets, weather Relieving factors:  Rest in bed Activity:  Once a month she cannot function   CT head on 04/03/2021 was normal.   Past NSAIDS:  ibuprofen, Elyxyb (discontinued) Past analgesics:  Tylenol; Goody powder Past abortive triptans:  Sumatriptan 50mg  tablet (causes head pressure and feels like throat is closing), Relpax (head pressure) Past abortive ergotamine:  none Past muscle relaxants:  none Past anti-emetic:  Zofran Past antihypertensive medications:  propranolol ER 120mg  daily Past antidepressant medications:  Lexapro (weight gain); Wellbutrin Past anticonvulsant medications: none Past anti-CGRP:  Ubrelvy 100mg  Past vitamins/Herbal/Supplements:  none Past antihistamines/decongestants:  none Other past therapies:  chiropractor     Family history of headache:  mother  PAST MEDICAL HISTORY:  Past Medical History:  Diagnosis Date   Hypothyroidism     MEDICATIONS: Current Outpatient Medications on File Prior to Visit  Medication Sig Dispense Refill   aspirin 325 MG tablet 81 mg. 81 mg     Dihydroergotamine Mesylate HFA (TRUDHESA)  0.725 MG/ACT AERS Place 0.725 mg into the nose as needed (1 spray in each nostril.  May repeat once after one hour (Maximum 4 total sprays in 24 hours)). 8 mL 5   Erenumab-aooe (AIMOVIG) 140 MG/ML SOAJ Inject 140 mg into the skin every 28 (twenty-eight) days. 1.12 mL 5   NURTEC 75 MG TBDP TAKE 1 TABLET BY MOUTH EVERY DAY AS NEEDED 16 tablet 0   ondansetron (ZOFRAN-ODT) 4 MG disintegrating tablet DISSOLVE 1 TABLET BY MOUTH EVERY 8 HOURS AS NEEDED FOR NAUSEA OR VOMITING 6 tablet 1   rosuvastatin (CRESTOR) 10 MG tablet Take 20 mg by mouth daily.     SYNTHROID 50 MCG tablet TAKE 1 TABLET BY MOUTH FOR 6 DAYS A WEEK     topiramate (TOPAMAX) 100 MG tablet Take 100 mg at bedtime. 90 tablet 0   No current facility-administered medications on file prior to visit.    ALLERGIES: Allergies  Allergen Reactions   Codeine    Latex     FAMILY HISTORY: Family History  Problem Relation Age of Onset   Thyroid disease Neg Hx       Objective:  *** General: No acute distress.  Patient appears ***-groomed.   Head:  Normocephalic/atraumatic Eyes:  Fundi examined but not visualized Neck: supple, no paraspinal tenderness, full range of motion Heart:  Regular rate and rhythm Lungs:  Clear to auscultation bilaterally Back: No paraspinal tenderness Neurological Exam: alert and oriented to person, place, and time.  Speech fluent and not dysarthric, language intact.  CN II-XII intact. Bulk and tone normal, muscle strength 5/5 throughout.  Sensation to light touch intact.  Deep tendon reflexes 2+ throughout, toes downgoing.  Finger to nose testing intact.  Gait normal, Romberg negative.   Shon Millet, DO  CC: ***

## 2022-07-02 ENCOUNTER — Ambulatory Visit: Payer: Managed Care, Other (non HMO) | Admitting: Neurology

## 2022-07-07 ENCOUNTER — Ambulatory Visit: Payer: Managed Care, Other (non HMO) | Admitting: Neurology

## 2022-07-19 NOTE — Progress Notes (Unsigned)
NEUROLOGY FOLLOW UP OFFICE NOTE  Shelby Maxwell [id number]  Assessment/Plan:   Migraine without aura, without status migrainosus, not intractable   Migraine prevention:  Will again try Aimovig 140mg  every 28 days.  May have better coverage this year.  Decrease topiramate back to 50mg  at bedtime Migraine rescue:  Trudhesa NS.  May use Nurtec if needed.  Zofran for nausea Limit use of pain relievers to no more than 2 days out of week to prevent risk of rebound or medication-overuse headache. Keep headache diary Follow up 4-5 months.     Subjective:  Shelby Maxwell is a 56 year old female with Hashimoto's thyroiditis who follows up for migraine.   UPDATE: Started Aimovig.  ***  Intensity:  moderate to severe Duration:  Nurtec varies.  Lasts 1 day (sometimes 2 days). Frequency:  Varies:  Nov 10, Dec 2, Jan 2, Feb 10, March 9. In Jan-Feb, she saw the chiropractor which may have helped.   Rescue therapy:  Nurtec and Zofran, Trudhesa NS second line. Current NSAIDS:  ASA 81mg  Current analgesics:  none Current triptans:  none Current ergotamine:  Trudhesa NS Current anti-emetic:  Zofran ODT 4mg  Current muscle relaxants:  none Current anti-anxiolytic:  none Current sleep aide:  none Current Antihypertensive medications:  none Current Antidepressant medications:  none Current Anticonvulsant medications: topiramate 100mg  QHS Current anti-CGRP:  Nurtec (abortive), Aimovig 140mg  Current Vitamins/Herbal/Supplements:  D Current Antihistamines/Decongestants:  none Other therapy:  cold compress for mild headaches. Hormone/birth control:  none   Caffeine:  1 cup of coffee daily Diet:  Drinks 2-3 16 oz bottles of water daily.  Tries to eat healthy.  Eats 3 meals a day.  No soda Exercise:  no Depression:  controlled; Anxiety:  Controlled.  She has a child with ADHD and autism Other pain:  Left sided neck pain.  Wakes up every morning with neck pain.   Sleep hygiene:  Ok (6-8 hours a  night) Works on computer 9 hours a day.     HISTORY:  Onset:  In her 65s.  They started becoming more frequent in 2020. Location:  Mostly behind right eye, frontal but sometimes base of neck. Quality:  Pressure, throbbing, pounding, squeezing Initial intensity:  8/10.  She denies new headache, thunderclap headache Aura:  no Premonitory Phase:  no Postdrome:  no Associated symptoms:  Nausea, photophobia, phonophobia, osmophobia.  She denies associated visual disturbance, vomiting, unilateral numbness or weakness. Initial duration:  She often wakes up with them.  8 to 10 hours.  Severe once last 24 hours Initial frequency:  2 to 3 days a week, once a month she cannot function Initial frequency of abortive medication: 2 to 3 days a week. Triggers:  Certain smells (blueberry scent, perfume, scented candles), dairy, very cold beverages, sweets, weather Relieving factors:  Rest in bed Activity:  Once a month she cannot function   CT head on 04/03/2021 was normal.   Past NSAIDS:  ibuprofen, Elyxyb (discontinued) Past analgesics:  Tylenol; Goody powder Past abortive triptans:  Sumatriptan 50mg  tablet (causes head pressure and feels like throat is closing), Relpax (head pressure) Past abortive ergotamine:  none Past muscle relaxants:  none Past anti-emetic:  Zofran Past antihypertensive medications:  propranolol ER 120mg  daily Past antidepressant medications:  Lexapro (weight gain); Wellbutrin Past anticonvulsant medications: none Past anti-CGRP:  Ubrelvy 100mg  Past vitamins/Herbal/Supplements:  none Past antihistamines/decongestants:  none Other past therapies:  chiropractor     Family history of headache:  mother  PAST MEDICAL HISTORY:  Past Medical History:  Diagnosis Date   Hypothyroidism     MEDICATIONS: Current Outpatient Medications on File Prior to Visit  Medication Sig Dispense Refill   aspirin 325 MG tablet 81 mg. 81 mg     Dihydroergotamine Mesylate HFA (TRUDHESA)  0.725 MG/ACT AERS Place 0.725 mg into the nose as needed (1 spray in each nostril.  May repeat once after one hour (Maximum 4 total sprays in 24 hours)). 8 mL 5   Erenumab-aooe (AIMOVIG) 140 MG/ML SOAJ Inject 140 mg into the skin every 28 (twenty-eight) days. 1.12 mL 5   NURTEC 75 MG TBDP TAKE 1 TABLET BY MOUTH EVERY DAY AS NEEDED 16 tablet 0   ondansetron (ZOFRAN-ODT) 4 MG disintegrating tablet DISSOLVE 1 TABLET BY MOUTH EVERY 8 HOURS AS NEEDED FOR NAUSEA OR VOMITING 6 tablet 1   rosuvastatin (CRESTOR) 10 MG tablet Take 20 mg by mouth daily.     SYNTHROID 50 MCG tablet TAKE 1 TABLET BY MOUTH FOR 6 DAYS A WEEK     topiramate (TOPAMAX) 100 MG tablet Take 100 mg at bedtime. 90 tablet 0   No current facility-administered medications on file prior to visit.    ALLERGIES: Allergies  Allergen Reactions   Codeine    Latex     FAMILY HISTORY: Family History  Problem Relation Age of Onset   Thyroid disease Neg Hx       Objective:  *** General: No acute distress.  Patient appears well-groomed.   Head:  Normocephalic/atraumatic Eyes:  Fundi examined but not visualized Neck: supple, no paraspinal tenderness, full range of motion Heart:  Regular rate and rhythm Lungs:  Clear to auscultation bilaterally Back: No paraspinal tenderness Neurological Exam: alert and oriented to person, place, and time.  Speech fluent and not dysarthric, language intact.  CN II-XII intact. Bulk and tone normal, muscle strength 5/5 throughout.  Sensation to light touch intact.  Deep tendon reflexes 2+ throughout, toes downgoing.  Finger to nose testing intact.  Gait normal, Romberg negative.   Shelby Clines, DO  CC: Shelby Fear, DO

## 2022-07-20 ENCOUNTER — Ambulatory Visit (INDEPENDENT_AMBULATORY_CARE_PROVIDER_SITE_OTHER): Payer: Managed Care, Other (non HMO) | Admitting: Neurology

## 2022-07-20 ENCOUNTER — Encounter: Payer: Self-pay | Admitting: Neurology

## 2022-07-20 VITALS — BP 149/84 | HR 66 | Ht 63.0 in | Wt 139.2 lb

## 2022-07-20 DIAGNOSIS — M542 Cervicalgia: Secondary | ICD-10-CM

## 2022-07-20 DIAGNOSIS — G43009 Migraine without aura, not intractable, without status migrainosus: Secondary | ICD-10-CM

## 2022-07-20 NOTE — Patient Instructions (Signed)
Continue Aimovig 140mg  every 28 days, topiramate 100mg  at bedtime Nurtec and Trudhesa as needed.  Try Trudhesa alone if have migraine in middle of night.  Zofran for nausea Continue neck exercises, keep computer screen at eye level, noncontoured memory foam pillow Follow up 9 months.

## 2022-08-20 ENCOUNTER — Other Ambulatory Visit: Payer: Self-pay | Admitting: Neurology

## 2022-09-03 ENCOUNTER — Ambulatory Visit: Payer: Managed Care, Other (non HMO) | Admitting: Sports Medicine

## 2022-09-13 ENCOUNTER — Ambulatory Visit: Payer: Managed Care, Other (non HMO) | Admitting: Neurology

## 2022-09-15 IMAGING — CT CT HEAD W/O CM
2 series · 15 of 30 positions shown, 17 images · non-contrast
Comparison: None.

CLINICAL DATA: Worsening headaches.

EXAM:
CT HEAD WITHOUT CONTRAST
TECHNIQUE: Contiguous axial images were obtained from the base of the skull
through the vertex without intravenous contrast.

[Series 2: head w/(date) · axial · 0.45mm/px · z∈[-142,-22]mm · 7 of 34 slices shown, 9 images]
[im 5/34  brain]
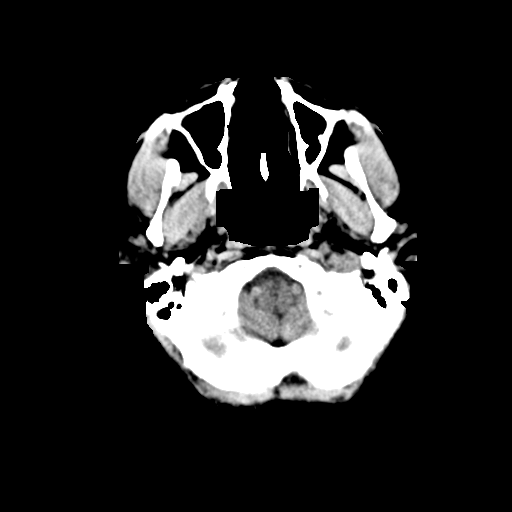
[im 5/34  bone]
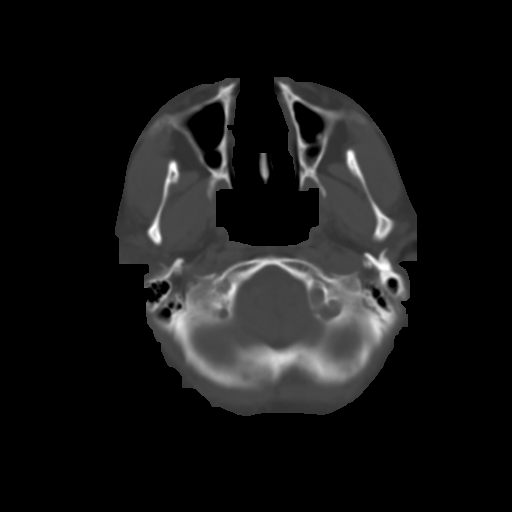
[im 9/34  brain]
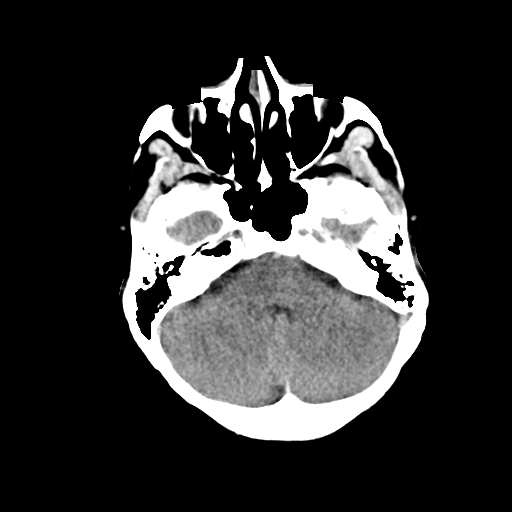
[im 13/34  brain]
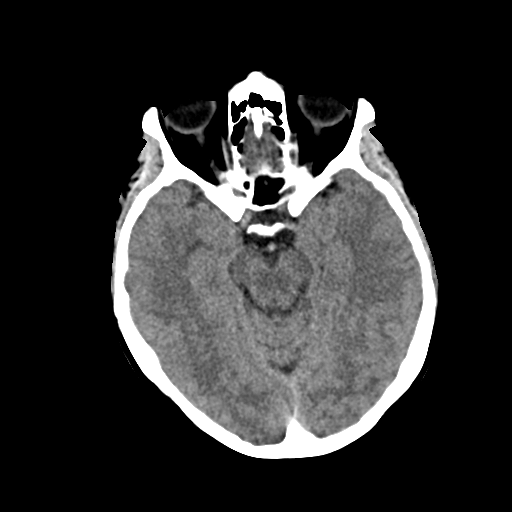
[im 17/34  brain]
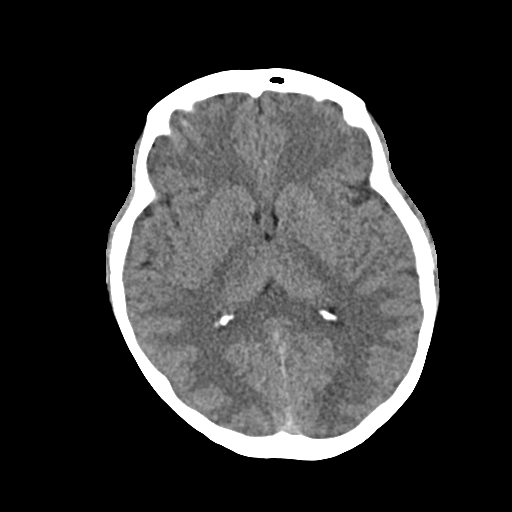
[im 21/34  brain]
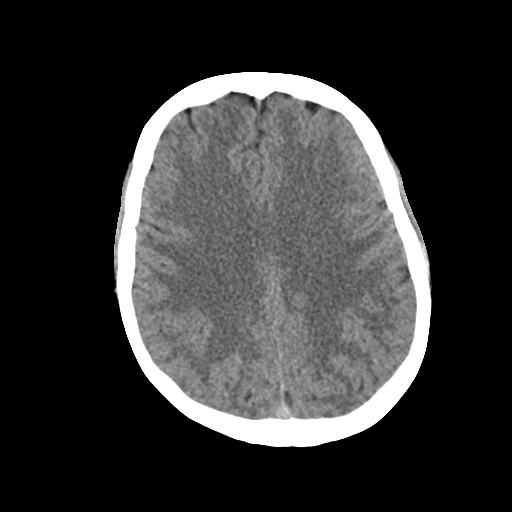
[im 21/34  bone]
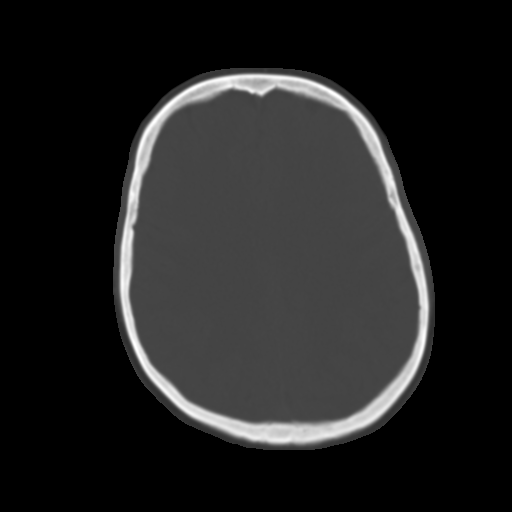
[im 25/34  brain]
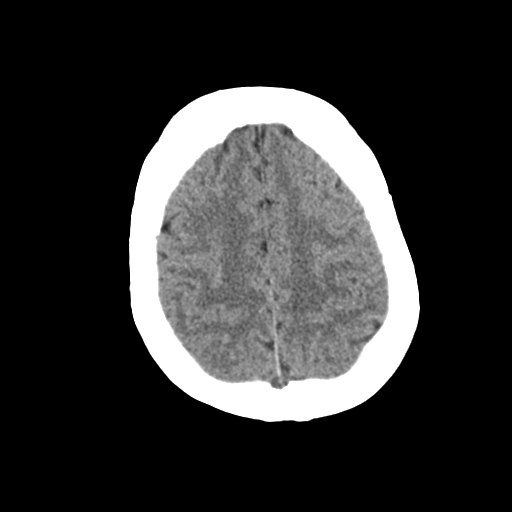
[im 29/34  brain]
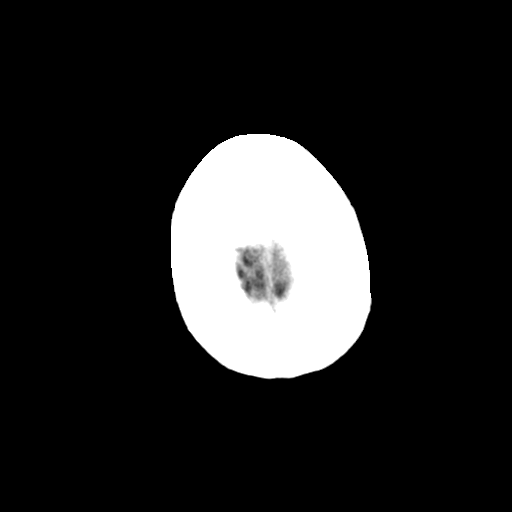

[Series 3: bone · axial · 0.45mm/px · z∈[-146,-12]mm · 8 of 85 slices shown]
[im 9/85  bone]
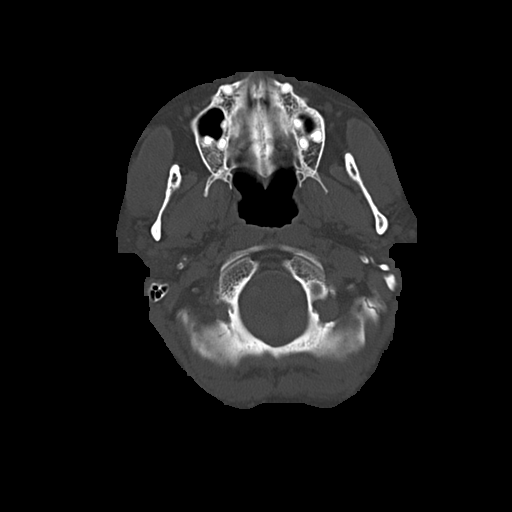
[im 17/85  bone]
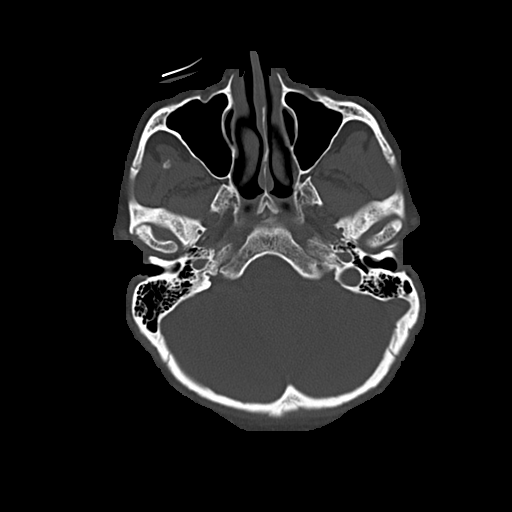
[im 26/85  bone]
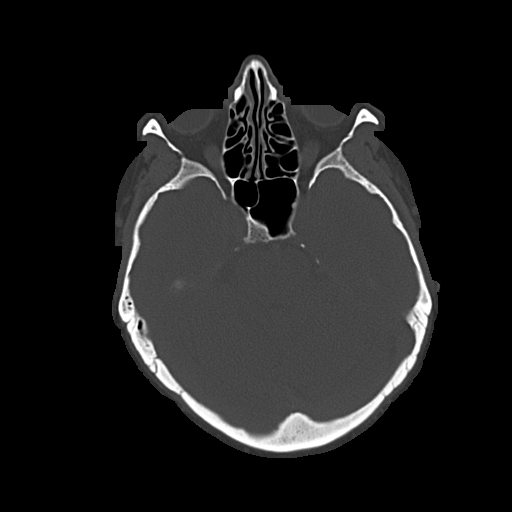
[im 38/85  bone]
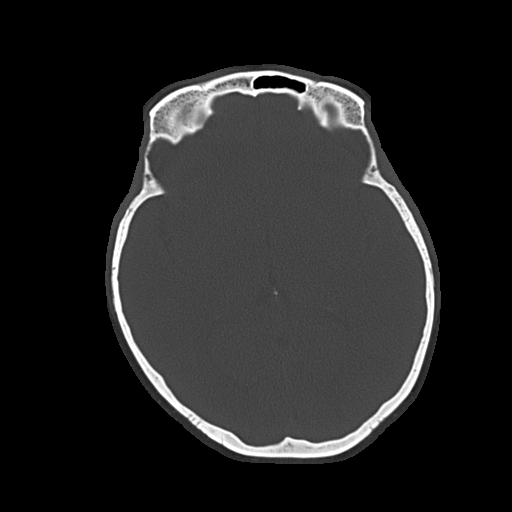
[im 47/85  bone]
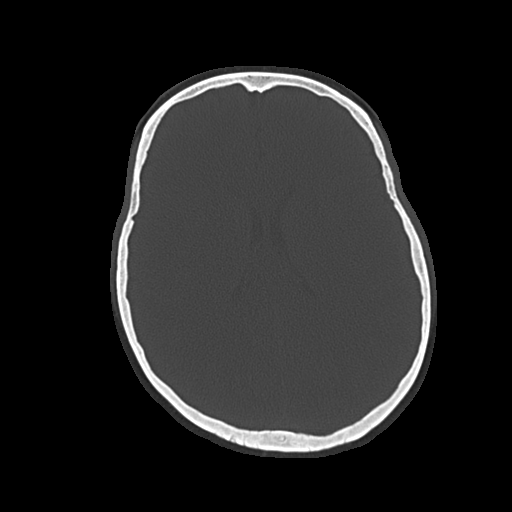
[im 59/85  bone]
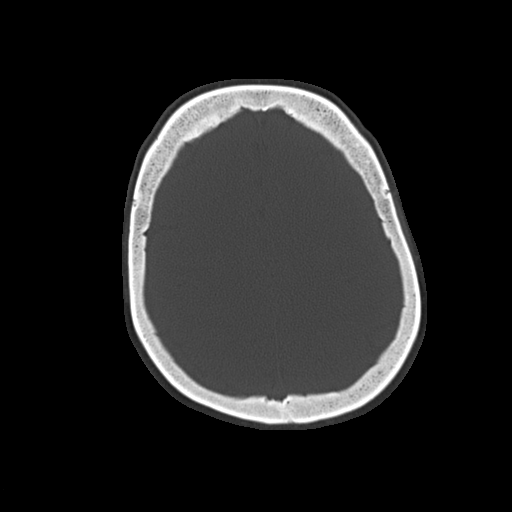
[im 68/85  bone]
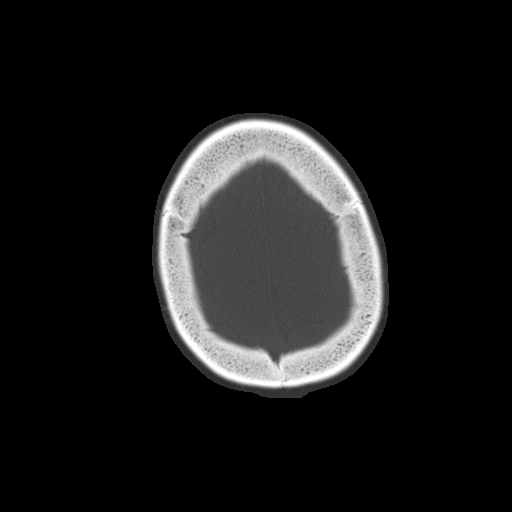
[im 76/85  bone]
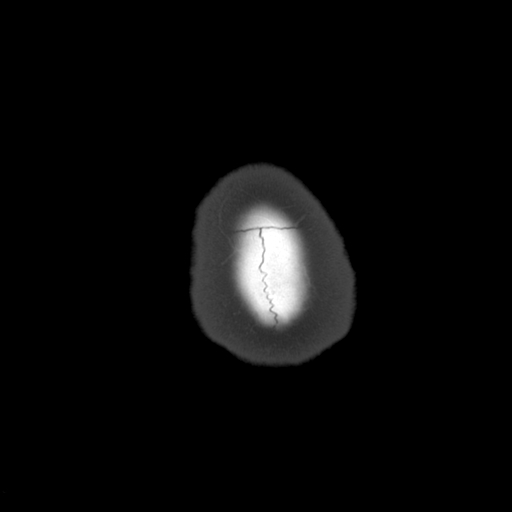

[15 of 30 positions shown; findings below may reference images not displayed]

FINDINGS: Brain: No evidence of acute infarction, hemorrhage, hydrocephalus,
extra-axial collection or mass lesion/mass effect.

Vascular: No hyperdense vessel or unexpected calcification.

Skull: Normal. Negative for fracture or focal lesion.

Sinuses/Orbits: No acute finding.
IMPRESSION: Negative head CT.

## 2022-10-26 ENCOUNTER — Encounter: Payer: Self-pay | Admitting: Neurology

## 2022-10-26 ENCOUNTER — Other Ambulatory Visit: Payer: Self-pay

## 2022-10-26 MED ORDER — NURTEC 75 MG PO TBDP: 1.0000 | ORAL_TABLET | Freq: Every day | ORAL | 1 refills | Status: DC | PRN

## 2022-11-08 ENCOUNTER — Other Ambulatory Visit: Payer: Self-pay | Admitting: Neurology

## 2022-12-06 ENCOUNTER — Other Ambulatory Visit: Payer: Self-pay | Admitting: Student in an Organized Health Care Education/Training Program

## 2022-12-06 DIAGNOSIS — M5412 Radiculopathy, cervical region: Secondary | ICD-10-CM

## 2022-12-11 ENCOUNTER — Ambulatory Visit
Admission: RE | Admit: 2022-12-11 | Discharge: 2022-12-11 | Disposition: A | Discharge status: Home / Self Care | Payer: Managed Care, Other (non HMO) | Source: Ambulatory Visit | Attending: Student in an Organized Health Care Education/Training Program | Admitting: Student in an Organized Health Care Education/Training Program

## 2022-12-11 DIAGNOSIS — M5412 Radiculopathy, cervical region: Secondary | ICD-10-CM

## 2022-12-14 ENCOUNTER — Other Ambulatory Visit: Payer: Managed Care, Other (non HMO)

## 2022-12-25 ENCOUNTER — Encounter: Payer: Self-pay | Admitting: Neurology

## 2022-12-31 ENCOUNTER — Other Ambulatory Visit: Payer: Self-pay | Admitting: Neurology

## 2022-12-31 MED ORDER — ELYXYB 120 MG/4.8ML PO SOLN: 120.0000 mg | Freq: Every day | ORAL | 11 refills | Status: DC | PRN

## 2023-01-04 ENCOUNTER — Telehealth: Payer: Self-pay

## 2023-01-04 NOTE — Telephone Encounter (Signed)
Per Patient, Looks like insurance is requesting a prior authorization for the medication. They stated it was faxed sat afternoon to your office. Let me know if you need it faxed again. Thanks [employer]     PA team Elxy[employer] needs a PA.

## 2023-01-05 ENCOUNTER — Other Ambulatory Visit (HOSPITAL_COMMUNITY): Payer: Self-pay

## 2023-01-05 ENCOUNTER — Telehealth: Payer: Self-pay

## 2023-01-05 NOTE — Telephone Encounter (Signed)
Patient Advocate Encounter  Prior Authorization for Elyxyb '120MG'$ /4.8ML solution has been approved through [employer].    Key: [id number]  Effective: 01-05-2023 to 01-04-2024

## 2023-01-05 NOTE — Telephone Encounter (Signed)
LMOVM approval.

## 2023-01-05 NOTE — Telephone Encounter (Signed)
Patient Advocate Encounter   Received notification from [employer] that prior authorization is required for Elyxyb '120MG'$ /4.8ML solution  Submitted: 01-05-2023 Key [id number]  Status is pending

## 2023-02-04 ENCOUNTER — Other Ambulatory Visit: Payer: Self-pay | Admitting: Neurology

## 2023-04-18 NOTE — Progress Notes (Unsigned)
NEUROLOGY FOLLOW UP OFFICE NOTE  Shelby Maxwell [id number]  Assessment/Plan:   1 Migraine without aura, without status migrainosus, not intractable 2  Cervicalgia - I think the neck pain is aggravating the headaches.   Migraine prevention:  Plan to start Emgality Migraine rescue:  Trudhesa NS and Nurtec.  Elyxyb no longer effective.  Zofran for nausea Limit use of pain relievers to no more than 2 days out of week to prevent risk of rebound or medication-overuse headache. Keep headache diary For neck pain - keep computer screen at eye level, use non-contoured memory foam pillow at night, continue neck exercises.  Declines muscle relaxer at this time. Follow up 6 months.     Subjective:  Shelby Maxwell is a 57 year old female with Hashimoto's thyroiditis who follows up for migraine.   UPDATE: With the new year, her insurance stopped covering Aimovig.  She found that Elyxyb with Nurtec was more effective for acute therapy.  No longer aborts it but dulls it. Trudhesa with Nurtec - 1 hour 10/10 -  Weaned self off topiramate to avoid polypharmacy.   Intensity:  moderate to severe Duration:  Nurtec varies.  Lasts 1 day (sometimes 2 days).  1 hour with Nurtec and Trudhesa, 12 hours with Nurtec alone.   Frequency:  8-11 a month Rescue therapy:  Nurtec and Zofran, Trudhesa NS second line. Current NSAIDS:  ASA 81mg  Current analgesics:  none Current triptans:  none Current ergotamine:  Trudhesa NS Current anti-emetic:  Zofran ODT 4mg  Current muscle relaxants:  none Current anti-anxiolytic:  none Current sleep aide:  none Current Antihypertensive medications:  none Current Antidepressant medications:  none Current Anticonvulsant medications: none Current anti-CGRP:  Nurtec (abortive), Aimovig 140mg  Current Vitamins/Herbal/Supplements:  D Current Antihistamines/Decongestants:  none Other therapy:  cold compress for mild headaches, PT for neck (pinched nerve). Hormone/birth  control:  none  For about a year, she notices that her right hand shakes when she is writing.  Not while typing or holding a cup or pouring cup of coffee.  No family history of tremor.     Caffeine:  1 cup of coffee daily Diet:  Drinks 2-3 16 oz bottles of water daily.  Tries to eat healthy.  Eats 3 meals a day.  No soda Exercise:  no Depression:  controlled; Anxiety:  A little more stress.  Changed jobs.  She has a child with ADHD and autism Other pain:  right sided neck pain.  Sees chiropractor.  Performs neck exercises at home.   Sleep hygiene:  Ok (6-8 hours a night) Works on computer 9 hours a day.     HISTORY:  Onset:  In her 57s.  They started becoming more frequent in 2020. Location:  Mostly behind right eye, frontal but sometimes base of neck. Quality:  Pressure, throbbing, pounding, squeezing Initial intensity:  8/10.  She denies new headache, thunderclap headache Aura:  no Premonitory Phase:  no Postdrome:  no Associated symptoms:  Nausea, photophobia, phonophobia, osmophobia.  She denies associated visual disturbance, vomiting, unilateral numbness or weakness. Initial duration:  She often wakes up with them.  8 to 10 hours.  Severe once last 24 hours Initial frequency:  2 to 3 days a week, once a month she cannot function Initial frequency of abortive medication: 2 to 3 days a week. Triggers:  Certain smells (blueberry scent, perfume, scented candles), dairy, very cold beverages, sweets, weather Relieving factors:  Rest in bed Activity:  Once a month she cannot function  CT head on 04/03/2021 was normal.   Past NSAIDS:  ibuprofen, Elyxyb (discontinued) Past analgesics:  Tylenol; Goody powder Past abortive triptans:  Sumatriptan 50mg  tablet (causes head pressure and feels like throat is closing), Relpax (head pressure) Past abortive ergotamine:  none Past muscle relaxants:  none Past anti-emetic:  Zofran Past antihypertensive medications:  propranolol ER 120mg   daily Past antidepressant medications:  Lexapro (weight gain); Wellbutrin Past anticonvulsant medications: topiramate Past anti-CGRP:  Ubrelvy 100mg , Aimovig (helpful, not covered) Past vitamins/Herbal/Supplements:  none Past antihistamines/decongestants:  none Other past therapies:  chiropractor     Family history of headache:  mother  PAST MEDICAL HISTORY: Past Medical History:  Diagnosis Date   Hypothyroidism     MEDICATIONS: Current Outpatient Medications on File Prior to Visit  Medication Sig Dispense Refill   Celecoxib (ELYXYB) 120 MG/4.8ML SOLN Take 120 mg by mouth daily as needed. 28.8 mL 11   Aspirin 81 MG CAPS 81 mg. 81 mg     Dihydroergotamine Mesylate HFA (TRUDHESA) 0.725 MG/ACT AERS Place 0.725 mg into the nose as needed (1 spray in each nostril.  May repeat once after one hour (Maximum 4 total sprays in 24 hours)). 8 mL 5   Erenumab-aooe (AIMOVIG) 140 MG/ML SOAJ Inject 140 mg into the skin every 28 (twenty-eight) days. 1.12 mL 5   ondansetron (ZOFRAN-ODT) 4 MG disintegrating tablet DISSOLVE 1 TABLET BY MOUTH EVERY 8 HOURS AS NEEDED FOR NAUSEA OR VOMITING 6 tablet 1   Rimegepant Sulfate (NURTEC) 75 MG TBDP Take 1 tablet by mouth daily as needed. 16 tablet 1   rosuvastatin (CRESTOR) 10 MG tablet Take 20 mg by mouth daily.     SYNTHROID 50 MCG tablet TAKE 1 TABLET BY MOUTH FOR 6 DAYS A WEEK     topiramate (TOPAMAX) 100 MG tablet TAKE 1 TABLET BY MOUTH EVERYDAY AT BEDTIME 90 tablet 0   No current facility-administered medications on file prior to visit.    ALLERGIES: Allergies  Allergen Reactions   Codeine    Latex     FAMILY HISTORY: Family History  Problem Relation Age of Onset   Thyroid disease Neg Hx       Objective:  Blood pressure 137/82, pulse 78, height 5\' 3"  (1.6 m), weight 147 lb (66.7 kg), SpO2 98 %. General: No acute distress.  Patient appears well-groomed.   Head:  Normocephalic/atraumatic    Shon Millet, DO  CC: Shelby Pont, DO

## 2023-04-20 ENCOUNTER — Ambulatory Visit (INDEPENDENT_AMBULATORY_CARE_PROVIDER_SITE_OTHER): Payer: Managed Care, Other (non HMO) | Admitting: Neurology

## 2023-04-20 ENCOUNTER — Encounter: Payer: Self-pay | Admitting: Neurology

## 2023-04-20 VITALS — BP 137/82 | HR 78 | Ht 63.0 in | Wt 147.0 lb

## 2023-04-20 DIAGNOSIS — G43009 Migraine without aura, not intractable, without status migrainosus: Secondary | ICD-10-CM

## 2023-04-20 DIAGNOSIS — R251 Tremor, unspecified: Secondary | ICD-10-CM | POA: Diagnosis not present

## 2023-04-20 MED ORDER — NURTEC 75 MG PO TBDP: 1.0000 | ORAL_TABLET | Freq: Every day | ORAL | 11 refills | Status: DC | PRN

## 2023-04-20 MED ORDER — TRUDHESA 0.725 MG/ACT NA AERS: 0.7250 mg | INHALATION_SPRAY | NASAL | 11 refills | Status: DC | PRN

## 2023-04-20 MED ORDER — EMGALITY 120 MG/ML ~~LOC~~ SOAJ: 240.0000 mg | Freq: Once | SUBCUTANEOUS | 0 refills | Status: AC

## 2023-04-20 NOTE — Patient Instructions (Addendum)
START EMGALITY - 2 INJECTIONS FOR FIRST DOSE, THEN 1 INJECTION EVERY 28 DAYS THEREAFTER.  CONTACT ME WHEN YOU PICK UP FIRST DOSE SO I WILL SEND PRESCRIPTION FOR STANDING ORDER TRUDHESA NASAL AND NURTEC MONITOR TREMOR

## 2023-04-26 ENCOUNTER — Encounter: Payer: Self-pay | Admitting: Neurology

## 2023-04-26 ENCOUNTER — Telehealth: Payer: Self-pay

## 2023-04-26 MED ORDER — EMGALITY 120 MG/ML ~~LOC~~ SOAJ: 120.0000 mg | SUBCUTANEOUS | 11 refills | Status: DC

## 2023-05-02 ENCOUNTER — Other Ambulatory Visit: Payer: Self-pay | Admitting: Neurology

## 2023-06-14 ENCOUNTER — Other Ambulatory Visit: Payer: Self-pay | Admitting: Neurology

## 2023-07-28 ENCOUNTER — Encounter: Payer: Self-pay | Admitting: Neurology

## 2023-07-29 ENCOUNTER — Other Ambulatory Visit: Payer: Self-pay | Admitting: Neurology

## 2023-07-29 MED ORDER — ONDANSETRON 4 MG PO TBDP: 4.0000 mg | ORAL_TABLET | Freq: Three times a day (TID) | ORAL | 5 refills | Status: DC | PRN

## 2023-08-15 ENCOUNTER — Other Ambulatory Visit (HOSPITAL_COMMUNITY): Payer: Self-pay

## 2023-08-18 ENCOUNTER — Encounter: Payer: Self-pay | Admitting: Neurology

## 2023-08-22 ENCOUNTER — Other Ambulatory Visit: Payer: Self-pay

## 2023-08-22 MED ORDER — QULIPTA 60 MG PO TABS: 60.0000 mg | ORAL_TABLET | Freq: Every day | ORAL | 0 refills | Status: DC

## 2023-08-22 NOTE — Progress Notes (Signed)
Per Dr.Jaffe note 10/25, The Bennie Pierini would take the place of Emgality, because it sounds like it isn't helping. Bennie Pierini is a daily pill. Let me know if you would like to switch. It will need a prior authorization from your insurance.

## 2023-08-23 ENCOUNTER — Encounter: Payer: Self-pay | Admitting: Neurology

## 2023-08-24 ENCOUNTER — Telehealth: Payer: Self-pay

## 2023-08-24 NOTE — Telephone Encounter (Signed)
Per patient Shelby Maxwell needs a Prior Approval.

## 2023-08-29 ENCOUNTER — Other Ambulatory Visit (HOSPITAL_COMMUNITY): Payer: Self-pay

## 2023-08-29 NOTE — Telephone Encounter (Signed)
Will need to call tomorrow to see who initiated the PA. If patient initiated, will likely need to do an appeal.

## 2023-09-25 ENCOUNTER — Other Ambulatory Visit: Payer: Self-pay | Admitting: Neurology

## 2023-09-28 ENCOUNTER — Telehealth: Payer: Self-pay | Admitting: Pharmacy Technician

## 2023-09-28 ENCOUNTER — Other Ambulatory Visit (HOSPITAL_COMMUNITY): Payer: Self-pay

## 2023-09-28 NOTE — Telephone Encounter (Signed)
Pharmacy Patient Advocate Encounter   Received notification from Fax that prior authorization for TRUDHESA is required/requested.   Insurance verification completed.   The patient is insured through [employer] .   Per test claim: PA required; PA submitted to above mentioned insurance via Fax Key/confirmation #/EOC [id number] Status is pending

## 2023-10-04 NOTE — Progress Notes (Unsigned)
NEUROLOGY FOLLOW UP OFFICE NOTE  Shelby Maxwell [id number]  Assessment/Plan:   1 Migraine without aura, without status migrainosus, not intractable 2  Cervicalgia    Migraine prevention:  Plan to start Emgality Migraine rescue:  Trudhesa NS and Nurtec.  Elyxyb no longer effective.  Zofran for nausea Limit use of pain relievers to no more than 2 days out of week to prevent risk of rebound or medication-overuse headache. Keep headache diary For neck pain - keep computer screen at eye level, use non-contoured memory foam pillow at night, continue neck exercises.  Declines muscle relaxer at this time. Follow up 6 months.     Subjective:  Shelby Maxwell is a 57 year old female with Hashimoto's thyroiditis who follows up for migraine.   UPDATE: Started Emgality.  No significant improvement, so she was changed to [place] after 3 months. *** Intensity:  moderate to severe Duration:  1 hour with Nurtec and Trudhesa *** Frequency:  8-11 a month*** Rescue therapy:  Nurtec and Zofran, Trudhesa NS second line. Current NSAIDS:  ASA 81mg  Current analgesics:  none Current triptans:  none Current ergotamine:  Trudhesa NS Current anti-emetic:  Zofran ODT 4mg  Current muscle relaxants:  none Current anti-anxiolytic:  none Current sleep aide:  none Current Antihypertensive medications:  none Current Antidepressant medications:  none Current Anticonvulsant medications: none Current anti-CGRP:  Qulipta 60mg  daily *** Current Vitamins/Herbal/Supplements:  D Current Antihistamines/Decongestants:  none Other therapy:  cold compress for mild headaches, PT for neck (pinched nerve). Hormone/birth control:  none  For about a year, she notices that her right hand shakes when she is writing.  Not while typing or holding a cup or pouring cup of coffee.  No family history of tremor.     Caffeine:  1 cup of coffee daily Diet:  Drinks 2-3 16 oz bottles of water daily.  Tries to eat healthy.  Eats  3 meals a day.  No soda Exercise:  no Depression:  controlled; Anxiety:  A little more stress.  Changed jobs.  She has a child with ADHD and autism Other pain:  right sided neck pain.  Sees chiropractor.  Performs neck exercises at home.   Sleep hygiene:  Ok (6-8 hours a night) Works on computer 9 hours a day.     HISTORY:  Onset:  In her 43s.  They started becoming more frequent in 2020. Location:  Mostly behind right eye, frontal but sometimes base of neck. Quality:  Pressure, throbbing, pounding, squeezing Initial intensity:  8/10.  She denies new headache, thunderclap headache Aura:  no Premonitory Phase:  no Postdrome:  no Associated symptoms:  Nausea, photophobia, phonophobia, osmophobia.  She denies associated visual disturbance, vomiting, unilateral numbness or weakness. Initial duration:  She often wakes up with them.  8 to 10 hours.  Severe once last 24 hours Initial frequency:  2 to 3 days a week, once a month she cannot function Initial frequency of abortive medication: 2 to 3 days a week. Triggers:  Certain smells (blueberry scent, perfume, scented candles), dairy, very cold beverages, sweets, weather Relieving factors:  Rest in bed Activity:  Once a month she cannot function   CT head on 04/03/2021 was normal.   Past NSAIDS:  ibuprofen, Elyxyb (discontinued) Past analgesics:  Tylenol; Goody powder Past abortive triptans:  Sumatriptan 50mg  tablet (causes head pressure and feels like throat is closing), Relpax (head pressure) Past abortive ergotamine:  none Past muscle relaxants:  none Past anti-emetic:  Zofran Past antihypertensive medications:  propranolol ER 120mg  daily Past antidepressant medications:  Lexapro (weight gain); Wellbutrin Past anticonvulsant medications: topiramate Past anti-CGRP:  Ubrelvy 100mg , Aimovig (helpful, not covered), Emgality, Nurtec Past vitamins/Herbal/Supplements:  none Past antihistamines/decongestants:  none Other past therapies:   chiropractor     Family history of headache:  mother  PAST MEDICAL HISTORY: Past Medical History:  Diagnosis Date   Hypothyroidism     MEDICATIONS: Current Outpatient Medications on File Prior to Visit  Medication Sig Dispense Refill   Aspirin 81 MG CAPS 81 mg. 81 mg     Atogepant (QULIPTA) 60 MG TABS Take 1 tablet (60 mg total) by mouth daily at 2 PM. 30 tablet 0   Celecoxib (ELYXYB) 120 MG/4.8ML SOLN Take 120 mg by mouth daily as needed. 28.8 mL 11   Dihydroergotamine Mesylate HFA (TRUDHESA) 0.725 MG/ACT AERS Place 0.725 mg into the nose as needed (1 spray in each nostril.  May repeat once after one hour (Maximum 4 total sprays in 24 hours)). 8 mL 11   ondansetron (ZOFRAN-ODT) 4 MG disintegrating tablet Take 1 tablet (4 mg total) by mouth every 8 (eight) hours as needed for nausea or vomiting. 20 tablet 5   Rimegepant Sulfate (NURTEC) 75 MG TBDP Take 1 tablet (75 mg total) by mouth daily as needed. 16 tablet 11   rosuvastatin (CRESTOR) 10 MG tablet Take 20 mg by mouth daily.     SYNTHROID 50 MCG tablet TAKE 1 TABLET BY MOUTH FOR 6 DAYS A WEEK     No current facility-administered medications on file prior to visit.    ALLERGIES: Allergies  Allergen Reactions   Codeine    Latex     FAMILY HISTORY: Family History  Problem Relation Age of Onset   Thyroid disease Neg Hx       Objective:  *** General: No acute distress.  Patient appears well-groomed.   Head:  Normocephalic/atraumatic Head:  Normocephalic/atraumatic Neck:  Supple.  No paraspinal tenderness.  Full range of motion. Heart:  Regular rate and rhythm. Neuro:  Alert and oriented.  Speech fluent and not dysarthric.  Language intact.  CN II-XII intact.  Bulk and tone normal.  Muscle strength 5/5 throughout.  Deep tendon reflexes 2+ throughout.  Gait normal.  Romberg negative.    Shon Millet, DO  CC: Lorelei Pont, DO

## 2023-10-05 ENCOUNTER — Encounter: Payer: Self-pay | Admitting: Neurology

## 2023-10-05 ENCOUNTER — Ambulatory Visit (INDEPENDENT_AMBULATORY_CARE_PROVIDER_SITE_OTHER): Payer: Managed Care, Other (non HMO) | Admitting: Neurology

## 2023-10-05 VITALS — BP 122/77 | HR 82 | Ht 63.0 in | Wt 151.8 lb

## 2023-10-05 DIAGNOSIS — G43009 Migraine without aura, not intractable, without status migrainosus: Secondary | ICD-10-CM

## 2023-10-05 NOTE — Patient Instructions (Signed)
Stop Emgality.  Start Qulipta 60mg  daily Use Nurtec as first line.  Elyxyb second line.  If you have a severe sudden onset migraine, take both together. Zofran for nausea Follow up 5 months.

## 2023-10-14 ENCOUNTER — Other Ambulatory Visit (HOSPITAL_COMMUNITY): Payer: Self-pay

## 2023-10-17 ENCOUNTER — Other Ambulatory Visit (HOSPITAL_COMMUNITY): Payer: Self-pay

## 2023-10-18 ENCOUNTER — Other Ambulatory Visit (HOSPITAL_COMMUNITY): Payer: Self-pay

## 2023-10-18 ENCOUNTER — Ambulatory Visit: Payer: Managed Care, Other (non HMO) | Admitting: Neurology

## 2023-10-23 ENCOUNTER — Other Ambulatory Visit: Payer: Self-pay | Admitting: Neurology

## 2023-10-24 ENCOUNTER — Other Ambulatory Visit (HOSPITAL_COMMUNITY): Payer: Self-pay

## 2023-10-24 NOTE — Telephone Encounter (Signed)
Called ins for f/u. 4 PAs had been started, but they all were closed b/c of lack of information/communication from the office. Looked at most recent notes and the Tacey Ruiz has been d/c due to side effects.

## 2023-11-22 NOTE — Telephone Encounter (Signed)
N/A for PA. Med has been d/c per Dr. Moises Blood notes in Dec, as previously stated.

## 2023-11-23 NOTE — Telephone Encounter (Signed)
ERROr

## 2023-12-31 ENCOUNTER — Other Ambulatory Visit: Payer: Self-pay | Admitting: Neurology

## 2024-01-26 ENCOUNTER — Encounter: Payer: Self-pay | Admitting: Neurology

## 2024-01-30 ENCOUNTER — Telehealth: Payer: Self-pay | Admitting: Pharmacy Technician

## 2024-01-30 NOTE — Telephone Encounter (Signed)
 PA has been submitted, and telephone encounter has been created. Please see telephone encounter dated 4.7.25.

## 2024-01-30 NOTE — Telephone Encounter (Signed)
 Pharmacy Patient Advocate Encounter   Received notification from Patient Advice Request messages that prior authorization for ELYXYB 120MG  is required/requested.   Insurance verification completed.   The patient is insured through [facility] .   Per test claim: PA required; PA submitted to above mentioned insurance via CoverMyMeds Key/confirmation #/EOC [id number] Status is pending

## 2024-02-01 ENCOUNTER — Other Ambulatory Visit (HOSPITAL_COMMUNITY): Payer: Self-pay

## 2024-02-01 NOTE — Telephone Encounter (Signed)
 Pharmacy Patient Advocate Encounter  Received notification from [facility] that Prior Authorization for Elyxyb 120MG /4.8ML solution  has been APPROVED from 01/30/2024 to 01/29/2025. Unable to obtain price due to refill too soon rejection, last fill date 04/07/20025 next available fill date04/09/2024

## 2024-03-06 NOTE — Progress Notes (Unsigned)
 NEUROLOGY FOLLOW UP OFFICE NOTE  Jleigh Disque [id number]  Assessment/Plan:   Migraine without aura, without status migrainosus, not intractable Cervicalgia  Tremor - likely essential tremor   Migraine prevention:  Qulipta  60mg  daily  Migraine rescue:  Nurtec with Elyxyb     Zofran  for nausea Limit use of pain relievers to no more than 9 days out of the month to prevent risk of rebound or medication-overuse headache. Monitor tremor - not interfering with lifestyle. For neck pain - keep computer screen at eye level, use non-contoured memory foam pillow at night, continue neck exercises.  Declines muscle relaxer at this time. Follow up 6 months.     Subjective:  ESCARLET DEHN is a 58 year old female with Hashimoto's thyroiditis who follows up for migraine.   UPDATE: Switched to Qulipta . Doing well. Intensity:  moderate to severe Duration:  1 hour with Nurtec and Elyxyb  Frequency:  2-4 in Dec-Mar, 12 in April (possibly weather and increased neck pain), 3 so far in May  Neck pain still present.  Does neck exercises.  Started using a non-contoured memory foam pillow.    Tremor in right hand since 2023, worse in last 6 months.  Notices it when writing or holding a cup.  Her maternal aunt had tremor.  Rescue therapy:  Nurtec and Zofran , Elyxyb  second line. Current NSAIDS:  ASA 81mg  Current analgesics:  none Current triptans:  none Current ergotamine:  none Current anti-emetic:  Zofran  ODT 4mg  Current muscle relaxants:  none Current anti-anxiolytic:  none Current sleep aide:  none Current Antihypertensive medications:  none Current Antidepressant medications:  none Current Anticonvulsant medications: none Current anti-CGRP:  none Current Vitamins/Herbal/Supplements:  D Current Antihistamines/Decongestants:  none Other therapy:  cold compress for mild headaches Hormone/birth control:  none  For about a year, she notices that her right hand shakes when she is writing.   Not while typing or holding a cup or pouring cup of coffee.  No family history of tremor.     Caffeine:  1 cup of coffee daily Diet:  Drinks 2-3 16 oz bottles of water daily.  Tries to eat healthy.  Eats 3 meals a day.  No soda Exercise:  no Depression:  controlled; Anxiety:  A little more stress.  Changed jobs.  She has a child with ADHD and autism Other pain:  right sided neck pain.  Sees chiropractor.  Performs neck exercises at home.   Sleep hygiene:  Ok (6-8 hours a night) Works on computer 9 hours a day.     HISTORY:  Onset:  In her 63s.  They started becoming more frequent in 2020. Location:  Mostly behind right eye, frontal but sometimes base of neck. Quality:  Pressure, throbbing, pounding, squeezing Initial intensity:  8/10.  She denies new headache, thunderclap headache Aura:  no Premonitory Phase:  no Postdrome:  no Associated symptoms:  Nausea, photophobia, phonophobia, osmophobia.  She denies associated visual disturbance, vomiting, unilateral numbness or weakness. Initial duration:  She often wakes up with them.  8 to 10 hours.  Severe once last 24 hours Initial frequency:  2 to 3 days a week, once a month she cannot function Initial frequency of abortive medication: 2 to 3 days a week. Triggers:  Certain smells (blueberry scent, perfume, scented candles), dairy, very cold beverages, sweets, weather Relieving factors:  Rest in bed Activity:  Once a month she cannot function   CT head on 04/03/2021 was normal.   Past NSAIDS:  ibuprofen, Elyxyb  (effective)  Past analgesics:  Tylenol; Goody powder Past abortive triptans:  Sumatriptan 50mg  tablet (causes head pressure and feels like throat is closing), Relpax  (head pressure) Past abortive ergotamine:  Trudhesa  NS (tachycardia) Past muscle relaxants:  none Past anti-emetic:  Zofran  Past antihypertensive medications:  propranolol  ER 120mg  daily Past antidepressant medications:  Lexapro (weight gain); Wellbutrin Past  anticonvulsant medications: topiramate  Past anti-CGRP:  Ubrelvy 100mg , Aimovig  (helpful, not covered) Past vitamins/Herbal/Supplements:  none Past antihistamines/decongestants:  none Other past therapies:  chiropractor, PT neck     Family history of headache:  mother  PAST MEDICAL HISTORY: Past Medical History:  Diagnosis Date   Hypothyroidism     MEDICATIONS: Current Outpatient Medications on File Prior to Visit  Medication Sig Dispense Refill   Aspirin 81 MG CAPS 81 mg. 81 mg     ondansetron  (ZOFRAN -ODT) 4 MG disintegrating tablet Take 1 tablet (4 mg total) by mouth every 8 (eight) hours as needed for nausea or vomiting. 20 tablet 5   rosuvastatin (CRESTOR) 10 MG tablet Take 20 mg by mouth daily.     SYNTHROID 50 MCG tablet TAKE 1 TABLET BY MOUTH FOR 6 DAYS A WEEK     No current facility-administered medications on file prior to visit.  Elyxb Qulipta  60mg  daily Nurtec 75mg  daily PRN   ALLERGIES: Allergies  Allergen Reactions   Codeine    Latex     FAMILY HISTORY: Family History  Problem Relation Age of Onset   Thyroid disease Neg Hx       Objective:  Blood pressure 130/86, pulse 71, height 5\' 3"  (1.6 m), weight 153 lb (69.4 kg), SpO2 99%. General: No acute distress.  Patient appears well-groomed.   Head:  Normocephalic/atraumatic Neck:  Supple.  No paraspinal tenderness.  Full range of motion. Heart:  Regular rate and rhythm. Neuro:  Alert and oriented.  Speech fluent and not dysarthric.  Language intact.  CN II-XII intact.  Bulk and tone normal.  No rigidity.  Muscle strength 5/5 throughout.  No bradykinesia.  No resting, postural or intention tremor but presented with action tremor with drawing Archimedes swirl.  Deep tendon reflexes 2+ throughout.  Gait normal with normal stride and arm swing.  Romberg negative.     Janne Members, DO  CC: Dorrine Gaudy, DO

## 2024-03-07 ENCOUNTER — Encounter: Payer: Self-pay | Admitting: Neurology

## 2024-03-07 ENCOUNTER — Ambulatory Visit (INDEPENDENT_AMBULATORY_CARE_PROVIDER_SITE_OTHER): Payer: Managed Care, Other (non HMO) | Admitting: Neurology

## 2024-03-07 VITALS — BP 130/86 | HR 71 | Ht 63.0 in | Wt 153.0 lb

## 2024-03-07 DIAGNOSIS — R251 Tremor, unspecified: Secondary | ICD-10-CM

## 2024-03-07 DIAGNOSIS — G43009 Migraine without aura, not intractable, without status migrainosus: Secondary | ICD-10-CM

## 2024-03-07 DIAGNOSIS — M542 Cervicalgia: Secondary | ICD-10-CM | POA: Diagnosis not present

## 2024-03-07 MED ORDER — ELYXYB 120 MG/4.8ML PO SOLN: ORAL | 5 refills | Status: DC

## 2024-03-07 MED ORDER — NURTEC 75 MG PO TBDP: 1.0000 | ORAL_TABLET | Freq: Every day | ORAL | 11 refills | Status: AC | PRN

## 2024-03-07 MED ORDER — QULIPTA 60 MG PO TABS: 60.0000 mg | ORAL_TABLET | Freq: Every day | ORAL | 1 refills | Status: DC

## 2024-03-07 NOTE — Patient Instructions (Signed)
 Qulipta  60mg  daily Nurtec and Elyzb for migraine attacks.  Zofran  for nausea Monitor tremor

## 2024-03-07 NOTE — Addendum Note (Signed)
 Addended byFestus Hubert, Allea Kassner R on: 03/07/2024 09:02 AM   Modules accepted: Level of Service

## 2024-04-29 ENCOUNTER — Other Ambulatory Visit: Payer: Self-pay | Admitting: Neurology

## 2024-05-29 ENCOUNTER — Other Ambulatory Visit: Payer: Self-pay | Admitting: Neurology

## 2024-05-29 ENCOUNTER — Other Ambulatory Visit: Payer: Self-pay

## 2024-05-29 MED ORDER — ONDANSETRON 8 MG PO TBDP: 8.0000 mg | ORAL_TABLET | ORAL | 5 refills | Status: DC | PRN

## 2024-05-29 NOTE — Telephone Encounter (Signed)
 New script request for Zofran  8 mg.  Per Dr.Jaffe okay to send in 8 mg.

## 2024-08-10 ENCOUNTER — Other Ambulatory Visit: Payer: Self-pay | Admitting: Neurology

## 2024-08-11 LAB — COLOGUARD: COLOGUARD: NEGATIVE

## 2024-09-04 ENCOUNTER — Other Ambulatory Visit: Payer: Self-pay | Admitting: Obstetrics and Gynecology

## 2024-09-04 DIAGNOSIS — R928 Other abnormal and inconclusive findings on diagnostic imaging of breast: Secondary | ICD-10-CM

## 2024-09-08 ENCOUNTER — Ambulatory Visit

## 2024-09-08 ENCOUNTER — Ambulatory Visit
Admission: RE | Admit: 2024-09-08 | Discharge: 2024-09-08 | Disposition: A | Source: Ambulatory Visit | Attending: Obstetrics and Gynecology

## 2024-09-08 DIAGNOSIS — R928 Other abnormal and inconclusive findings on diagnostic imaging of breast: Secondary | ICD-10-CM

## 2024-09-19 NOTE — Progress Notes (Signed)
 NEUROLOGY FOLLOW UP OFFICE NOTE  Shelby Maxwell [id number]  Assessment/Plan:   Migraine without aura, without status migrainosus, not intractable Cervicalgia  Tremor - likely essential tremor   Migraine prevention:  Qulipta  60mg  daily  Migraine rescue:  Nurtec with Elyxyb     Zofran  for nausea Limit use of pain relievers to no more than 9 days out of the month to prevent risk of rebound or medication-overuse headache. Monitor tremor - not interfering with lifestyle. For neck pain - keep computer screen at eye level, use non-contoured memory foam pillow at night, continue neck exercises.  Declines muscle relaxer at this time. Follow up 7 months.     Subjective:  Shelby Maxwell is a 58 year old female with Hashimoto's thyroiditis who follows up for migraine.   UPDATE: Migraines: Doing well. Intensity:  moderate to severe Duration:  1 hour with Nurtec and Elyxyb  Frequency:  5 to 6 a month on average, over the last 6 months she has had 3 severe ones.  Neck pain: A little better but still daily.  Takes more breaks and stretches more.  Does neck exercises.  Using a non-contoured memory foam pillow.  Trigger point injections ineffective.  She had an injection 1 or 2 years ago which helped.  Tremor: Discontinued Wellbutrin 2 months ago and it is improved but not resolved.  Still notices it when she first begins to write.    Rescue therapy:  Nurtec and Zofran , Elyxyb  second line. Current NSAIDS:  ASA 81mg  Current analgesics:  none Current triptans:  none Current ergotamine:  none Current anti-emetic:  Zofran  ODT 4mg  Current muscle relaxants:  none Current anti-anxiolytic:  none Current sleep aide:  none Current Antihypertensive medications:  none Current Antidepressant medications:  none Current Anticonvulsant medications: none Current anti-CGRP:  none Current Vitamins/Herbal/Supplements:  D Current Antihistamines/Decongestants:  none Other therapy:  cold compress for  mild headaches Hormone/birth control:  none  For about a year, she notices that her right hand shakes when she is writing.  Not while typing or holding a cup or pouring cup of coffee.  No family history of tremor.     Caffeine:  1 cup of coffee daily Diet:  Drinks 2-3 16 oz bottles of water daily.  Tries to eat healthy.  Eats 3 meals a day.  No soda Exercise:  no Depression:  controlled; Anxiety:  A little more stress.  Changed jobs.  She has a child with ADHD and autism Other pain:  right sided neck pain.  Sees chiropractor.  Performs neck exercises at home.   Sleep hygiene:  Ok (6-8 hours a night) Works on computer 9 hours a day.     HISTORY:  Migraines: Onset:  In her 1s.  They started becoming more frequent in 2020. Location:  Mostly behind right eye, frontal but sometimes base of neck. Quality:  Pressure, throbbing, pounding, squeezing Initial intensity:  8/10.  She denies new headache, thunderclap headache Aura:  no Premonitory Phase:  no Postdrome:  no Associated symptoms:  Nausea, photophobia, phonophobia, osmophobia.  She denies associated visual disturbance, vomiting, unilateral numbness or weakness. Initial duration:  She often wakes up with them.  8 to 10 hours.  Severe once last 24 hours Initial frequency:  2 to 3 days a week, once a month she cannot function Initial frequency of abortive medication: 2 to 3 days a week. Triggers:  Certain smells (blueberry scent, perfume, scented candles), dairy, very cold beverages, sweets, weather Relieving factors:  Rest in bed  Activity:  Once a month she cannot function   CT head on 04/03/2021 was normal.  Family history of headache:  mother  Tremor: Tremor in right hand since 2023, worse in last 6 months.  Notices it when writing or holding a cup.  Her maternal aunt had tremor.   Past medications: Past NSAIDS:  ibuprofen, Elyxyb  (effective) Past analgesics:  Tylenol; Goody powder Past abortive triptans:  Sumatriptan 50mg   tablet (causes head pressure and feels like throat is closing), Relpax  (head pressure) Past abortive ergotamine:  Trudhesa  NS (tachycardia) Past muscle relaxants:  none Past anti-emetic:  Zofran  Past antihypertensive medications:  propranolol  ER 120mg  daily Past antidepressant medications:  Lexapro (weight gain); Wellbutrin Past anticonvulsant medications: topiramate  Past anti-CGRP:  Ubrelvy 100mg , Aimovig  (helpful, not covered) Past vitamins/Herbal/Supplements:  none Past antihistamines/decongestants:  none Other past therapies:  chiropractor, PT neck       PAST MEDICAL HISTORY: Past Medical History:  Diagnosis Date   Hypothyroidism     MEDICATIONS: Current Outpatient Medications on File Prior to Visit  Medication Sig Dispense Refill   Aspirin 81 MG CAPS 81 mg. 81 mg     Atogepant  (QULIPTA ) 60 MG TABS Take 1 tablet (60 mg total) by mouth daily at 2 PM. 90 tablet 1   Celecoxib  (ELYXYB ) 120 MG/4.8ML SOLN TAKE 4.8ML BY MOUTH DAILY AS NEEDED.TAKE 4.8ML BY MOUTH DAILY AS NEEDED. 28.8 mL 0   ondansetron  (ZOFRAN -ODT) 8 MG disintegrating tablet Take 1 tablet (8 mg total) by mouth every 8 (eight) hours as needed for nausea or vomiting. 18 tablet 5   Rimegepant Sulfate (NURTEC) 75 MG TBDP Take 1 tablet (75 mg total) by mouth daily as needed. 16 tablet 11   rosuvastatin (CRESTOR) 10 MG tablet Take 20 mg by mouth daily.     SYNTHROID 50 MCG tablet TAKE 1 TABLET BY MOUTH FOR 6 DAYS A WEEK     No current facility-administered medications on file prior to visit.  Elyxb Qulipta  60mg  daily Nurtec 75mg  daily PRN   ALLERGIES: Allergies  Allergen Reactions   Codeine    Latex     FAMILY HISTORY: Family History  Problem Relation Age of Onset   Thyroid disease Neg Hx       Objective:   Vitals:   09/24/24 0841  BP: (!) 143/88  Pulse: 78  SpO2: 99%   General: No acute distress.  Patient appears well-groomed.   Head:  Normocephalic/atraumatic Neck:  Supple.  No paraspinal  tenderness.  Full range of motion. Heart:  Regular rate and rhythm. Neuro:  No postural or kinetic tremor in hands when outstretched and with finger to nose testing     Juliene Dunnings, DO  CC: Oneil Robins, DO

## 2024-09-24 ENCOUNTER — Encounter: Payer: Self-pay | Admitting: Neurology

## 2024-09-24 ENCOUNTER — Ambulatory Visit (INDEPENDENT_AMBULATORY_CARE_PROVIDER_SITE_OTHER): Admitting: Neurology

## 2024-09-24 VITALS — BP 143/88 | HR 78 | Ht 63.0 in | Wt 160.0 lb

## 2024-09-24 DIAGNOSIS — G43009 Migraine without aura, not intractable, without status migrainosus: Secondary | ICD-10-CM

## 2024-09-24 DIAGNOSIS — M542 Cervicalgia: Secondary | ICD-10-CM | POA: Diagnosis not present

## 2024-09-24 DIAGNOSIS — R251 Tremor, unspecified: Secondary | ICD-10-CM | POA: Diagnosis not present

## 2024-09-24 MED ORDER — ONDANSETRON 8 MG PO TBDP: 8.0000 mg | ORAL_TABLET | Freq: Three times a day (TID) | ORAL | 5 refills | Status: AC | PRN

## 2024-09-24 MED ORDER — ELYXYB 120 MG/4.8ML PO SOLN: ORAL | 5 refills | Status: DC

## 2024-09-24 MED ORDER — QULIPTA 60 MG PO TABS: 60.0000 mg | ORAL_TABLET | Freq: Every day | ORAL | 1 refills | Status: AC

## 2024-11-12 ENCOUNTER — Other Ambulatory Visit: Payer: Self-pay | Admitting: Neurology

## 2024-11-14 ENCOUNTER — Other Ambulatory Visit (HOSPITAL_COMMUNITY): Payer: Self-pay

## 2024-11-14 ENCOUNTER — Telehealth: Payer: Self-pay | Admitting: Pharmacy Technician

## 2024-11-14 NOTE — Telephone Encounter (Signed)
 PA has been submitted, and telephone encounter has been created. Please see telephone encounter dated 1.21.26.

## 2024-11-14 NOTE — Telephone Encounter (Signed)
 Pharmacy Patient Advocate Encounter   Received notification from RX Request Messages that prior authorization for ELYXYB  120MG  is required/requested.   Insurance verification completed.   The patient is insured through [employer].   Per test claim: PA required; PA submitted to above mentioned insurance via Latent Key/confirmation #/EOC [id number] Status is pending

## 2024-11-15 ENCOUNTER — Other Ambulatory Visit (HOSPITAL_COMMUNITY): Payer: Self-pay

## 2024-11-16 ENCOUNTER — Other Ambulatory Visit (HOSPITAL_COMMUNITY): Payer: Self-pay

## 2024-11-23 NOTE — Telephone Encounter (Signed)
 Pharmacy Patient Advocate Encounter  Received notification from [employer] that Prior Authorization for ELYXYB  120MG   has been DENIED.  Full denial letter will be uploaded to the media tab. See denial reason below.  We denied this request under your pharmacy benefit because: The requested medication is not included in your plan's formulary (a list of mediations covered by your plan). Your physician can locate other medications under your plan by visiting the plan website. Alternatives on the preferred drug list include: celecoxib  capsule, meloxicam tablet, riztriptan, sumatriptan, nurtec*, holland* (*requires prior authorization) The information submitted does not include documentation that shows the need for this requested medication over other medications covered on the drug list.   PA #/Case ID/Reference #: [id number]

## 2024-11-26 NOTE — Telephone Encounter (Signed)
 Information has been sent to clinical pharmacist for appeals review. It may take 5-7 days to prepare the necessary documentation to request the appeal from the insurance.

## 2024-11-27 ENCOUNTER — Telehealth: Payer: Self-pay | Admitting: Pharmacist

## 2024-11-27 ENCOUNTER — Other Ambulatory Visit (HOSPITAL_COMMUNITY): Payer: Self-pay

## 2025-04-24 ENCOUNTER — Ambulatory Visit: Admitting: Neurology
